# Patient Record
Sex: Male | Born: 1975 | Race: Black or African American | Hispanic: No | Marital: Single | Smoking: Current every day smoker
Health system: Southern US, Community
[De-identification: ages and names within clinical notes are randomized; demographics above are authoritative.]

## PROBLEM LIST (undated history)

## (undated) DIAGNOSIS — K56609 Unspecified intestinal obstruction, unspecified as to partial versus complete obstruction: Secondary | ICD-10-CM

## (undated) HISTORY — PX: ABDOMINAL SURGERY: SHX537

## (undated) HISTORY — DX: Unspecified intestinal obstruction, unspecified as to partial versus complete obstruction: K56.609

---

## 2004-09-05 ENCOUNTER — Emergency Department (HOSPITAL_COMMUNITY): Admission: EM | Admit: 2004-09-05 | Discharge: 2004-09-05 | Payer: Self-pay | Admitting: Emergency Medicine

## 2012-07-02 ENCOUNTER — Emergency Department (HOSPITAL_COMMUNITY)
Admission: EM | Admit: 2012-07-02 | Discharge: 2012-07-02 | Disposition: A | Discharge status: Home / Self Care | Payer: Self-pay | Attending: Emergency Medicine | Admitting: Emergency Medicine

## 2012-07-02 ENCOUNTER — Emergency Department (HOSPITAL_COMMUNITY): Payer: Self-pay

## 2012-07-02 ENCOUNTER — Encounter (HOSPITAL_COMMUNITY): Payer: Self-pay | Admitting: *Deleted

## 2012-07-02 DIAGNOSIS — W208XXA Other cause of strike by thrown, projected or falling object, initial encounter: Secondary | ICD-10-CM | POA: Insufficient documentation

## 2012-07-02 DIAGNOSIS — R079 Chest pain, unspecified: Secondary | ICD-10-CM | POA: Insufficient documentation

## 2012-07-02 DIAGNOSIS — S20229A Contusion of unspecified back wall of thorax, initial encounter: Secondary | ICD-10-CM | POA: Insufficient documentation

## 2012-07-02 DIAGNOSIS — W19XXXA Unspecified fall, initial encounter: Secondary | ICD-10-CM

## 2012-07-02 DIAGNOSIS — S20219A Contusion of unspecified front wall of thorax, initial encounter: Secondary | ICD-10-CM | POA: Insufficient documentation

## 2012-07-02 DIAGNOSIS — R109 Unspecified abdominal pain: Secondary | ICD-10-CM | POA: Insufficient documentation

## 2012-07-02 DIAGNOSIS — S301XXA Contusion of abdominal wall, initial encounter: Secondary | ICD-10-CM | POA: Insufficient documentation

## 2012-07-02 DIAGNOSIS — F17200 Nicotine dependence, unspecified, uncomplicated: Secondary | ICD-10-CM | POA: Insufficient documentation

## 2012-07-02 DIAGNOSIS — R296 Repeated falls: Secondary | ICD-10-CM | POA: Insufficient documentation

## 2012-07-02 DIAGNOSIS — M549 Dorsalgia, unspecified: Secondary | ICD-10-CM | POA: Insufficient documentation

## 2012-07-02 LAB — URINALYSIS, ROUTINE W REFLEX MICROSCOPIC
Bilirubin Urine: NEGATIVE
Glucose, UA: NEGATIVE mg/dL
Nitrite: NEGATIVE
Specific Gravity, Urine: 1.02 (ref 1.005–1.030)

## 2012-07-02 LAB — URINE MICROSCOPIC-ADD ON

## 2012-07-02 NOTE — ED Notes (Signed)
Patient transported to X-ray 

## 2012-07-02 NOTE — ED Provider Notes (Signed)
History     CSN: [id number]  Arrival date & time 07/02/12  1158   First MD Initiated Contact with Patient 07/02/12 1227      Chief Complaint  Patient presents with  . Back Pain  . Chest Pain  . Abdominal Pain    (Consider location/radiation/quality/duration/timing/severity/associated sxs/prior treatment) Patient is a 36 y.o. male presenting with back pain, chest pain, and abdominal pain. The history is provided by the patient.  Back Pain  Associated symptoms include chest pain and abdominal pain.  Chest Pain Primary symptoms include abdominal pain.    Abdominal Pain The primary symptoms of the illness include abdominal pain.  Additional symptoms associated with the illness include back pain.  He was carrying a 60 inch television with a friend when he fell with the TV landing on his chest and abdomen. Since then, he has been having pain in his chest, abdomen, and back. Denies head injury. Pain has been improving and is currently rated at last 10. However, today, he urinated and it looked like blood. He has a history of abdominal surgery for bowel obstruction on 2 occasions. He is otherwise in good health. He denies dizziness or weakness or nausea or vomiting.  History reviewed. No pertinent past medical history.  Past Surgical History  Procedure Date  . Abdominal surgery     bowel obstruction x 2    History reviewed. No pertinent family history.  History  Substance Use Topics  . Smoking status: Current Everyday Smoker  . Smokeless tobacco: Not on file  . Alcohol Use: Yes      Review of Systems  Cardiovascular: Positive for chest pain.  Gastrointestinal: Positive for abdominal pain.  Musculoskeletal: Positive for back pain.  All other systems reviewed and are negative.    Allergies  Review of patient's allergies indicates not on file.  Home Medications  No current outpatient prescriptions on file.  BP 135/86  Pulse 83  Temp 98.2 F (36.8 C) (Oral)  Resp  18  SpO2 99%  Physical Exam  Nursing note and vitals reviewed.  36 year old male who is resting comfortably and in no acute distress. Vital signs are normal. Oxygen saturation is 99% which is normal. Head is normocephalic and atraumatic. PERRLA, EOMI. Neck is nontender. Back is nontender lungs are clear without rales, wheezes, rhonchi. Heart has regular rate rhythm without murmur. There is no chest wall tenderness. Abdomen is soft with only mild tenderness across the upper abdomen. There is no rebound or guarding. Peristalsis is normal active. Extremities have full range of motion, no cyanosis or edema. Skin is warm and dry without rash. Neurologic: Mental status is normal, cranial nerves are intact, there are no motor or sensory deficits.  ED Course  Procedures (including critical care time)  Results for orders placed during the hospital encounter of 07/02/12  URINALYSIS, ROUTINE W REFLEX MICROSCOPIC      Component Value Range   Color, Urine YELLOW  YELLOW   APPearance CLEAR  CLEAR   Specific Gravity, Urine 1.020  1.005 - 1.030   pH 6.0  5.0 - 8.0   Glucose, UA NEGATIVE  NEGATIVE mg/dL   Hgb urine dipstick NEGATIVE  NEGATIVE   Bilirubin Urine NEGATIVE  NEGATIVE   Ketones, ur NEGATIVE  NEGATIVE mg/dL   Protein, ur NEGATIVE  NEGATIVE mg/dL   Urobilinogen, UA 0.2  0.0 - 1.0 mg/dL   Nitrite NEGATIVE  NEGATIVE   Leukocytes, UA TRACE (*) NEGATIVE  URINE MICROSCOPIC-ADD ON  Component Value Range   WBC, UA 0-2  <3 WBC/hpf   Dg Chest 2 View  07/02/2012  *RADIOLOGY REPORT*  Clinical Data: Back pain.  Chest pain.  Abdominal pain.  CHEST - 2 VIEW  Comparison: None.  Findings:  Cardiopericardial silhouette within normal limits. Mediastinal contours normal. Trachea midline.  No airspace disease or effusion.  No displaced sternal fracture is identified on the lateral view.  IMPRESSION: Negative two-view chest.  Original Report Authenticated By: Andreas Newport, M.D.   Dg Thoracic Spine  W/swimmers  07/02/2012  *RADIOLOGY REPORT*  Clinical Data: Fall.  Chest pain.  THORACIC SPINE - 2 VIEW + SWIMMERS  Comparison: None.  Findings: Anatomic alignment of the thoracic spine.  Vertebral body height and intervertebral disc spaces preserved.  Cervicothoracic junction normal on the swimmer's view.  Incidental note is made of an old right L1 transverse process fracture or small vestigial rib.  IMPRESSION: No acute abnormality.  Original Report Authenticated By: Andreas Newport, M.D.   Dg Lumbar Spine Complete  07/02/2012  *RADIOLOGY REPORT*  Clinical Data: Fall.  Back pain.  LUMBAR SPINE - COMPLETE 4+ VIEW  Comparison: None.  Findings: Mild levoconvex curvature may be positional or due to spasm.  Small ossicle is present adjacent to the right L1 transverse process.  This may represent vestigial rib or an old right L1 transverse process fracture.  This is well corticated. Vertebral body height is preserved.  There is no spondylolisthesis. Intervertebral disc spaces appear normal. There are no pars defects.  IMPRESSION: No acute abnormality.  Original Report Authenticated By: Andreas Newport, M.D.      1. Fall   2. Contusion of chest   3. Contusion, back   4. Contusion, abdominal wall       MDM  Contusions of the chest, abdomen, and back. Urinalysis will be obtained and if he actually is having hematuria, CT scan will be obtained to rule out internal injury. However, since he is hemodynamically stable several days after the fall and with no signs of serious injury on exam, I do not feel he needs any imaging of his urinalysis does not show blood.   Urinalysis has come back negative. Patient is informed of this. He states that he wants x-rays done of his back and chest and abdomen to be sure there is not any underlying injury. He was advised that there is no indication for CT scan and there is significant radiation exposure associated with CT scan. Also advised that findings on plain x-rays would  not change treatment but he still wants x-rays done and they were ordered.   Are negative for fracture. He is discharged with instructions to take over-the-counter analgesics as needed.  Dione Booze, MD 07/02/12 1438

## 2012-07-02 NOTE — ED Notes (Signed)
Pt back from X-ray.  

## 2012-07-02 NOTE — ED Notes (Signed)
Pt reports he was carrying a tv when he fell and now has pain in his abdomen chest and upper right side of back. Pt reports he had dark colored urine this morning and was concerned that he has blood in his urine.

## 2012-07-02 NOTE — ED Notes (Signed)
Pt states 3 days ago he was carrying a tv and he lost balance falling backwards and the tv landed ontop of him. Pt reports back, chest and abdominal discomfort. Reports dark urine this am. Ambulatory without difficulty. Reports pain increases with certain movements.

## 2012-10-22 ENCOUNTER — Emergency Department (HOSPITAL_COMMUNITY)
Admission: EM | Admit: 2012-10-22 | Discharge: 2012-10-22 | Disposition: A | Discharge status: Home / Self Care | Payer: No Typology Code available for payment source | Attending: Emergency Medicine | Admitting: Emergency Medicine

## 2012-10-22 ENCOUNTER — Emergency Department (HOSPITAL_COMMUNITY): Payer: No Typology Code available for payment source

## 2012-10-22 ENCOUNTER — Encounter (HOSPITAL_COMMUNITY): Payer: Self-pay | Admitting: Emergency Medicine

## 2012-10-22 DIAGNOSIS — S0993XA Unspecified injury of face, initial encounter: Secondary | ICD-10-CM | POA: Insufficient documentation

## 2012-10-22 DIAGNOSIS — M542 Cervicalgia: Secondary | ICD-10-CM

## 2012-10-22 DIAGNOSIS — S0990XA Unspecified injury of head, initial encounter: Secondary | ICD-10-CM | POA: Insufficient documentation

## 2012-10-22 DIAGNOSIS — Y939 Activity, unspecified: Secondary | ICD-10-CM | POA: Insufficient documentation

## 2012-10-22 DIAGNOSIS — Y929 Unspecified place or not applicable: Secondary | ICD-10-CM | POA: Insufficient documentation

## 2012-10-22 DIAGNOSIS — W208XXA Other cause of strike by thrown, projected or falling object, initial encounter: Secondary | ICD-10-CM | POA: Insufficient documentation

## 2012-10-22 DIAGNOSIS — F17200 Nicotine dependence, unspecified, uncomplicated: Secondary | ICD-10-CM | POA: Insufficient documentation

## 2012-10-22 MED ORDER — CYCLOBENZAPRINE HCL 10 MG PO TABS: 10.0000 mg | ORAL_TABLET | Freq: Three times a day (TID) | ORAL | Status: DC | PRN

## 2012-10-22 MED ORDER — IBUPROFEN 600 MG PO TABS: 600.0000 mg | ORAL_TABLET | Freq: Four times a day (QID) | ORAL | Status: DC | PRN

## 2012-10-22 NOTE — ED Notes (Signed)
Pt presents w/ after a mirror falling on the back of his head on Monday, states large, heavy mirror. Did not seek treatment at time of injury and was not knocked out. Is having headaches, neck and shoulder pain from the mirror throwing his head forward. Has not taken any meds for this. Denies blurred vision, or memory loss, a couple episodes of nausea w/o emesis.

## 2012-10-23 NOTE — ED Provider Notes (Signed)
Medical screening examination/treatment/procedure(s) were performed by non-physician practitioner and as supervising physician I was immediately available for consultation/collaboration.  Jones Skene, M.D.     Jones Skene, MD 10/23/12 [id number]

## 2012-11-04 NOTE — ED Provider Notes (Signed)
History     CSN: [id number]  Arrival date & time 10/22/12  1309   First MD Initiated Contact with Patient 10/22/12 1351      Chief Complaint  Patient presents with  . Head Injury    (Consider location/radiation/quality/duration/timing/severity/associated sxs/prior treatment) Patient is a 36 y.o. male presenting with head injury. The history is provided by the patient. No language interpreter was used.  Head Injury  The incident occurred more than 2 days ago. He came to the ER via walk-in. The injury mechanism was a direct blow. There was no loss of consciousness. There was no blood loss. The quality of the pain is described as throbbing. The pain is at a severity of 8/10. The pain is moderate. The pain has been constant since the injury. Pertinent negatives include no numbness, no blurred vision, no vomiting, no disorientation, no weakness and no memory loss. He has tried nothing for the symptoms.  36 yo male with c/o h/a with neck pain since a mirror fell on his head 3 days ago.  He has taken nothing for the pain.  No visible bruising or lumps.  Neuro in tact.  History reviewed. No pertinent past medical history.  Past Surgical History  Procedure Date  . Abdominal surgery     bowel obstruction x 2    No family history on file.  History  Substance Use Topics  . Smoking status: Current Every Day Smoker -- 1.0 packs/day    Types: Cigars  . Smokeless tobacco: Never Used  . Alcohol Use: 10.8 oz/week    7 Glasses of wine, 6 Shots of liquor, 6 Drinks containing 0.5 oz of alcohol per week      Review of Systems  Constitutional: Negative.  Negative for fever.  HENT: Positive for neck pain.   Eyes: Negative.  Negative for blurred vision.  Respiratory: Negative.   Cardiovascular: Negative.   Gastrointestinal: Negative.  Negative for nausea and vomiting.  Neurological: Positive for headaches. Negative for dizziness, syncope, facial asymmetry, speech difficulty, weakness,  light-headedness and numbness.  Psychiatric/Behavioral: Negative.  Negative for memory loss.  All other systems reviewed and are negative.    Allergies  Benadryl and Promethazine  Home Medications   Current Outpatient Rx  Name  Route  Sig  Dispense  Refill  . CYCLOBENZAPRINE HCL 10 MG PO TABS   Oral   Take 1 tablet (10 mg total) by mouth 3 (three) times daily as needed for muscle spasms.   30 tablet   0   . IBUPROFEN 600 MG PO TABS   Oral   Take 1 tablet (600 mg total) by mouth every 6 (six) hours as needed for pain.   30 tablet   0     BP 127/87  Pulse 80  Temp 98.6 F (37 C) (Oral)  SpO2 100%  Physical Exam  Nursing note and vitals reviewed. Constitutional: He is oriented to person, place, and time. He appears well-developed and well-nourished.  HENT:  Head: Normocephalic.  Eyes: Conjunctivae normal and EOM are normal. Pupils are equal, round, and reactive to light.  Neck: Trachea normal, normal range of motion and full passive range of motion without pain. Neck supple. Muscular tenderness present. No spinous process tenderness present. No rigidity. No edema, no erythema and normal range of motion present.       Nexus criteria met  Cardiovascular: Normal rate.   Pulmonary/Chest: Effort normal.  Abdominal: Soft.  Musculoskeletal: Normal range of motion.  Neurological: He is alert  and oriented to person, place, and time. He has normal reflexes. He displays normal reflexes. No cranial nerve deficit or sensory deficit. He exhibits normal muscle tone. Coordination and gait normal. GCS eye subscore is 4. GCS verbal subscore is 5. GCS motor subscore is 6.  Skin: Skin is warm and dry.  Psychiatric: He has a normal mood and affect.    ED Course  Procedures (including critical care time)  Labs Reviewed - No data to display No results found.   1. Head injury   2. Neck pain       MDM  Head injury 3 days ago with - CT of the head today.  Nexus criteria met.  No  need for cervical films.  Ibuprofen or tylenol for pain.  rx for flexeril.  Follow up with pcp of choice as needed.        Remi Haggard, NP 11/04/12 1103

## 2012-11-05 NOTE — ED Provider Notes (Signed)
Medical screening examination/treatment/procedure(s) were performed by non-physician practitioner and as supervising physician I was immediately available for consultation/collaboration.  John-Adam Quintana Canelo, M.D.     John-Adam Abie Killian, MD 11/05/12 0132 

## 2013-08-04 ENCOUNTER — Encounter (HOSPITAL_COMMUNITY): Payer: Self-pay | Admitting: Emergency Medicine

## 2013-08-04 ENCOUNTER — Emergency Department (INDEPENDENT_AMBULATORY_CARE_PROVIDER_SITE_OTHER)
Admission: EM | Admit: 2013-08-04 | Discharge: 2013-08-04 | Disposition: A | Discharge status: Home / Self Care | Payer: Self-pay | Source: Home / Self Care | Attending: Family Medicine | Admitting: Family Medicine

## 2013-08-04 DIAGNOSIS — L709 Acne, unspecified: Secondary | ICD-10-CM

## 2013-08-04 DIAGNOSIS — L708 Other acne: Secondary | ICD-10-CM

## 2013-08-04 MED ORDER — MINOCYCLINE HCL 50 MG PO CAPS: 100.0000 mg | ORAL_CAPSULE | Freq: Two times a day (BID) | ORAL | Status: DC

## 2013-08-04 NOTE — ED Notes (Signed)
Pt c/o small abscess on right side of right eye onset 2 weeks sxs include: pain, gradually getting worse Denies: fevers, drainage... Hx of abscess Alert w/no signs of acute distress.

## 2013-08-04 NOTE — ED Provider Notes (Signed)
  CSN: [id number]     Arrival date & time 08/04/13  1943 History     First MD Initiated Contact with Patient 08/04/13 2016     Chief Complaint  Patient presents with  . Abscess   (Consider location/radiation/quality/duration/timing/severity/associated sxs/prior Treatment) Patient is a 37 y.o. male presenting with abscess. The history is provided by the patient.  Abscess Location:  Face Facial abscess location:  Face Red streaking: no   Progression:  Worsening Chronicity:  New Relieved by:  Nothing Pt complains of large cyst that come up on his face.  Pt is out of minocycle and is requesting  History reviewed. No pertinent past medical history. Past Surgical History  Procedure Laterality Date  . Abdominal surgery      bowel obstruction x 2   No family history on file. History  Substance Use Topics  . Smoking status: Current Every Day Smoker -- 1.00 packs/day    Types: Cigars  . Smokeless tobacco: Never Used  . Alcohol Use: 10.8 oz/week    7 Glasses of wine, 6 Shots of liquor, 6 Drinks containing 0.5 oz of alcohol per week    Review of Systems  HENT: Positive for facial swelling.   All other systems reviewed and are negative.    Allergies  Benadryl and Promethazine  Home Medications   Current Outpatient Rx  Name  Route  Sig  Dispense  Refill  . cyclobenzaprine (FLEXERIL) 10 MG tablet   Oral   Take 1 tablet (10 mg total) by mouth 3 (three) times daily as needed for muscle spasms.   30 tablet   0   . ibuprofen (ADVIL,MOTRIN) 600 MG tablet   Oral   Take 1 tablet (600 mg total) by mouth every 6 (six) hours as needed for pain.   30 tablet   0   . minocycline (MINOCIN) 50 MG capsule   Oral   Take 2 capsules (100 mg total) by mouth 2 (two) times daily.   60 capsule   2    BP 125/93  Pulse 75  Temp(Src) 98 F (36.7 C) (Oral)  Resp 16  SpO2 100% Physical Exam  Nursing note and vitals reviewed. Constitutional: He appears well-developed and well-nourished.   HENT:  Head: Normocephalic and atraumatic.  Eyes: Conjunctivae are normal. Pupils are equal, round, and reactive to light.  Musculoskeletal: Normal range of motion.  Neurological: He is alert.  Skin: Skin is warm.  Large pimples/cyst both sides of face    ED Course   Procedures (including critical care time)  Labs Reviewed - No data to display No results found. 1. Acne     MDM  minocyline  Elson Areas, PA-C 08/04/13 2021  Lonia Skinner [place], [place] 08/04/13 2022

## 2013-08-07 NOTE — ED Provider Notes (Signed)
Medical screening examination/treatment/procedure(s) were performed by resident physician or non-physician practitioner and as supervising physician I was immediately available for consultation/collaboration.   Drue Camera DOUGLAS MD.   Kleber Crean D Helaina Stefano, MD 08/07/13 1512 

## 2014-02-10 ENCOUNTER — Encounter (HOSPITAL_COMMUNITY): Payer: Self-pay | Admitting: Emergency Medicine

## 2014-02-10 ENCOUNTER — Emergency Department (HOSPITAL_COMMUNITY)
Admission: EM | Admit: 2014-02-10 | Discharge: 2014-02-10 | Disposition: A | Discharge status: Home / Self Care | Payer: No Typology Code available for payment source | Attending: Emergency Medicine | Admitting: Emergency Medicine

## 2014-02-10 DIAGNOSIS — B349 Viral infection, unspecified: Secondary | ICD-10-CM

## 2014-02-10 DIAGNOSIS — B9789 Other viral agents as the cause of diseases classified elsewhere: Secondary | ICD-10-CM | POA: Insufficient documentation

## 2014-02-10 DIAGNOSIS — F17200 Nicotine dependence, unspecified, uncomplicated: Secondary | ICD-10-CM | POA: Insufficient documentation

## 2014-02-10 DIAGNOSIS — H109 Unspecified conjunctivitis: Secondary | ICD-10-CM | POA: Insufficient documentation

## 2014-02-10 DIAGNOSIS — R112 Nausea with vomiting, unspecified: Secondary | ICD-10-CM | POA: Insufficient documentation

## 2014-02-10 DIAGNOSIS — Z792 Long term (current) use of antibiotics: Secondary | ICD-10-CM | POA: Insufficient documentation

## 2014-02-10 MED ORDER — ONDANSETRON HCL 4 MG PO TABS: 4.0000 mg | ORAL_TABLET | Freq: Four times a day (QID) | ORAL | Status: DC

## 2014-02-10 MED ORDER — ONDANSETRON 4 MG PO TBDP
4.0000 mg | ORAL_TABLET | Freq: Once | ORAL | Status: AC
  Administered 2014-02-10: 4 mg via ORAL
  Filled 2014-02-10: qty 1

## 2014-02-10 MED ORDER — TETRACAINE HCL 0.5 % OP SOLN
2.0000 [drp] | Freq: Once | OPHTHALMIC | Status: AC
  Administered 2014-02-10: 2 [drp] via OPHTHALMIC
  Filled 2014-02-10: qty 2

## 2014-02-10 MED ORDER — ERYTHROMYCIN 5 MG/GM OP OINT: TOPICAL_OINTMENT | OPHTHALMIC | Status: DC

## 2014-02-10 MED ORDER — FLUORESCEIN SODIUM 1 MG OP STRP
1.0000 | ORAL_STRIP | Freq: Once | OPHTHALMIC | Status: AC
  Administered 2014-02-10: 1 via OPHTHALMIC
  Filled 2014-02-10: qty 1

## 2014-02-10 MED ORDER — ACETAMINOPHEN 325 MG PO TABS
650.0000 mg | ORAL_TABLET | Freq: Once | ORAL | Status: AC
  Administered 2014-02-10: 650 mg via ORAL
  Filled 2014-02-10: qty 2

## 2014-02-10 NOTE — ED Provider Notes (Signed)
CSN: [id number]     Arrival date & time 02/10/14  1209 History  This chart was scribed for non-physician practitioner, Raymon Mutton, PA-C working with Gavin Pound. Oletta Lamas, MD by Greggory Stallion, ED scribe. This patient was seen in room WTR7/WTR7 and the patient's care was started at 12:35 PM.   Chief Complaint  Patient presents with  . Eye Drainage  . Headache   The history is provided by the patient. No language interpreter was used.   HPI Comments: Jori Thrall is a 38 y.o. male who presents to the Emergency Department complaining of bilateral eye itching and yellow drainage that started 2 days ago. Symptoms started in the left eye and moved to the right. His eye is matted shut in the mornings. Pt states it doesn't feel like he has a foreign body in his eye. He states he also has a gradual onset, worsening, pressure headache that started last night. Pt has had nausea and one episode of emesis last night. Denies head injury. Denies fever, chills, nasal congestion, sore throat, difficulty swallowing, eye pain, blurred vision, sudden loss of vision, cough, SOB, difficulty breathing, chest pain, abdominal pain, constipation, diarrhea, neck pain, neck stiffness. Denies sick contacts.   History reviewed. No pertinent past medical history. Past Surgical History  Procedure Laterality Date  . Abdominal surgery      bowel obstruction x 2   No family history on file. History  Substance Use Topics  . Smoking status: Current Every Day Smoker -- 1.00 packs/day    Types: Cigars  . Smokeless tobacco: Never Used  . Alcohol Use: 10.8 oz/week    7 Glasses of wine, 6 Shots of liquor, 6 Drinks containing 0.5 oz of alcohol per week    Review of Systems  Constitutional: Negative for fever and chills.  HENT: Negative for congestion and trouble swallowing.   Eyes: Positive for discharge and itching. Negative for pain and visual disturbance.  Respiratory: Negative for cough and shortness of breath.    Cardiovascular: Negative for chest pain.  Gastrointestinal: Positive for nausea and vomiting. Negative for abdominal pain, diarrhea and constipation.  Musculoskeletal: Negative for neck pain and neck stiffness.  Neurological: Positive for headaches.  All other systems reviewed and are negative.   Allergies  Benadryl and Promethazine  Home Medications   Current Outpatient Rx  Name  Route  Sig  Dispense  Refill  . cyclobenzaprine (FLEXERIL) 10 MG tablet   Oral   Take 1 tablet (10 mg total) by mouth 3 (three) times daily as needed for muscle spasms.   30 tablet   0   . erythromycin ophthalmic ointment      Place a 1/2 inch ribbon of ointment into the lower eyelid of both eyes 4 times per day for 6 days.   1 g   0   . ibuprofen (ADVIL,MOTRIN) 600 MG tablet   Oral   Take 1 tablet (600 mg total) by mouth every 6 (six) hours as needed for pain.   30 tablet   0   . minocycline (MINOCIN) 50 MG capsule   Oral   Take 2 capsules (100 mg total) by mouth 2 (two) times daily.   60 capsule   2   . ondansetron (ZOFRAN) 4 MG tablet   Oral   Take 1 tablet (4 mg total) by mouth every 6 (six) hours.   12 tablet   0    BP 146/94  Pulse 85  Temp(Src) 98.5 F (36.9 C) (Oral)  Resp 18  SpO2 100%  Physical Exam  Nursing note and vitals reviewed. Constitutional: He is oriented to person, place, and time. He appears well-developed and well-nourished. No distress.  HENT:  Head: Normocephalic and atraumatic.  Mouth/Throat: Oropharynx is clear and moist. No oropharyngeal exudate.  Eyes: EOM are normal. Right eye exhibits discharge. Right eye exhibits no chemosis, no exudate and no hordeolum. No foreign body present in the right eye. Left eye exhibits discharge. Left eye exhibits no chemosis, no exudate and no hordeolum. No foreign body present in the left eye. Right conjunctiva is injected. Right conjunctiva has no hemorrhage. Left conjunctiva is injected. Left conjunctiva has no  hemorrhage. Right eye exhibits normal extraocular motion and no nystagmus. Left eye exhibits normal extraocular motion and no nystagmus. Right pupil is round and reactive. Left pupil is round and reactive.  Fundoscopic exam:      The right eye shows no AV nicking, no exudate, no hemorrhage and no papilledema.       The left eye shows no AV nicking, no exudate, no hemorrhage and no papilledema.  Slit lamp exam:      The right eye shows no corneal abrasion, no corneal flare, no corneal ulcer, no foreign body, no hyphema, no fluorescein uptake and no anterior chamber bulge.       The left eye shows no corneal abrasion, no corneal flare, no corneal ulcer, no foreign body, no hyphema, no fluorescein uptake and no anterior chamber bulge.  Neck: Normal range of motion. Neck supple. No tracheal deviation present.  Cardiovascular: Normal rate, regular rhythm and normal heart sounds.  Exam reveals no friction rub.   No murmur heard. Pulses:      Radial pulses are 2+ on the right side, and 2+ on the left side.  Pulmonary/Chest: Effort normal and breath sounds normal. No respiratory distress. He has no wheezes. He has no rales.  Musculoskeletal: Normal range of motion.  Full ROM to upper and lower extremities without difficulty noted, negative ataxia noted.  Lymphadenopathy:    He has no cervical adenopathy.  Neurological: He is alert and oriented to person, place, and time. No cranial nerve deficit. He exhibits normal muscle tone. Coordination normal.  Cranial nerves III-XII grossly intact  Skin: Skin is warm and dry.  Psychiatric: He has a normal mood and affect. His behavior is normal.    ED Course  Procedures (including critical care time)  DIAGNOSTIC STUDIES: Oxygen Saturation is 100% on RA, normal by my interpretation.    COORDINATION OF CARE: 12:43 PM-Discussed treatment plan which includes visual acuity and checking for corneal abrasion or ulcer with pt at bedside and pt agreed to plan.    Labs Review Labs Reviewed - No data to display Imaging Review No results found.  EKG Interpretation   None       MDM   Final diagnoses:  Conjunctivitis  Viral syndrome   Filed Vitals:   02/10/14 1212  BP: 146/94  Pulse: 85  Temp: 98.5 F (36.9 C)  TempSrc: Oral  Resp: 18  SpO2: 100%   I personally performed the services described in this documentation, which was scribed in my presence. The recorded information has been reviewed and is accurate.  Patient presenting to the ED with bilateral eye infection that has been ongoing for the past 2 days. Patient reported that the eye discomfort started in his left eye - reported that the left eye was tearing, crusts on eyelashes and difficulty opening the lids up  in the morning. Stated that he was using the same wash rag and itching the eyes. Reported that the right eye started the other day with the same symptoms. Reported that he has been having a headache and reported feeling nauseous - reported that he had one episode of emesis while coming in to the ED. Reported that the headache was a gradual onset - denied thunderclap and worst headache of life - described as a pressure sensation to the nasal bridge. Denied abdominal pain, BM changes, chest pain, shortness of breath, difficulty breathing, urinary issues, neck pain, neck stiffness, changes to appetite.  Alert and oriented. GCS 15. Heart rate and rhythm normal. Lungs clear to auscultation. Radial pulses 2+ bilaterally. Cap refill less than 3 seconds. Negative neck stiffness, negative rigidity-cervical lymphadenopathy. Negative trismus. Unremarkable oral exam. Bilateral eyes with positive injection to the sclerae with positive active tearing noted-clear tears. Mild discharge noted to the medial canthus of bilateral eyes. Injected palpebral conjunctiva bilaterally. Negative swelling or erythema surrounding the orbits. EOMs intact. PERRLA. Negative pain upon palpation to the frontal  maxillary sinuses. Discomfort upon palpation to the nasal bone-nasal bridge. Fundoscopic exam negative findings. Slit-lamp negative for corneal abrasion, negative uptake. Negative Seidel sign. Negative dendritic lesion. Negative findings on visual acuity. Doubt herpetic keratitis. Doubt irisitis. Doubt preseptal and post-septal cellulitis. Doubt corneal abrasion. Doubt SAH. Doubt ICH. Doubt SBO. Suspicion to be bilateral conjunctivitis with viral syndrome. Patient stable, afebrile. Nausea controlled in ED setting. Headache controlled in ED setting. Patient able to tolerate fluids and food - negative episodes of emesis in ED setting. Discharged patient with antibiotics and zofran. Referred patient to urgent care Center and ophthalmologist. Discussed with patient to rest and stay hydrated. Discussed with patient to closely monitor symptoms and if symptoms are to worsen or change to report back to the ED - strict return instructions given.  Patient agreed to plan of care, understood, all questions answered.   Raymon Mutton, PA-C 02/11/14 [id number]

## 2014-02-10 NOTE — Discharge Instructions (Signed)
Please call and set-up an appointment with Dr. Karleen HampshireSpencer to be re-assessed - eye doctor Please rest and stay hydrated Please take antibiotics as prescribed Please wash each shirts, pillow case, read your face touches to prevent further spreading Please avoid any fat or greasy foods.  Please continue monitor symptoms closely and if symptoms are to worsen or change (fever greater than 101, chills, neck pain, neck stiffness, worsening eye pain, swelling to the eyes, worsening drainage, alert vision, visual changes, numbness, tingling) please report back to emergency department immediately   Bacterial Conjunctivitis Bacterial conjunctivitis (commonly called pink eye) is redness, soreness, or puffiness (inflammation) of the white part of your eye. It is caused by a germ called bacteria. These germs can easily spread from person to person (contagious). Your eye often will become red or pink. Your eye may also become irritated, watery, or have a thick discharge.  HOME CARE   Apply a cool, clean washcloth over closed eyelids. Do this for 10 20 minutes, 3 4 times a day while you have pain.  Gently wipe away any fluid coming from the eye with a warm, wet washcloth or cotton ball.  Wash your hands often with soap and water. Use paper towels to dry your hands.  Do not share towels or washcloths.  Change or wash your pillowcase every day.  Do not use eye makeup until the infection is gone.  Do not use machines or drive if your vision is blurry.  Stop using contact lenses. Do not use them again until your doctor says it is okay.  Do not touch the tip of the eye drop bottle or medicine tube with your fingers when you put medicine on the eye. GET HELP RIGHT AWAY IF:   Your eye is not better after 3 days of starting your medicine.  You have a yellowish fluid coming out of the eye.  You have more pain in the eye.  Your eye redness is spreading.  Your vision becomes blurry.  You have a fever or  lasting symptoms for more than 2-3 days.  You have a fever and your symptoms suddenly get worse.  You have pain in the face.  Your face gets red or puffy (swollen). MAKE SURE YOU:   Understand these instructions.  Will watch this condition.  Will get help right away if you are not doing well or get worse. Document Released: 09/22/2008 Document Revised: 11/30/2012 Document Reviewed: 09/22/2008 [facility] Patient Information 2014 [place], [place].   Emergency Department Resource Guide 1) Find a Doctor and Pay Out of Pocket Although you won't have to find out who is covered by your insurance plan, it is a good idea to ask around and get recommendations. You will then need to call the office and see if the doctor you have chosen will accept you as a new patient and what types of options they offer for patients who are self-pay. Some doctors offer discounts or will set up payment plans for their patients who do not have insurance, but you will need to ask so you aren't surprised when you get to your appointment.  2) Contact Your Local Health Department Not all health departments have doctors that can see patients for sick visits, but many do, so it is worth a call to see if yours does. If you don't know where your local health department is, you can check in your phone book. The CDC also has a tool to help you locate your state's health department, and  many state websites also have listings of all of their local health departments.  3) Find a Walk-in Clinic If your illness is not likely to be very severe or complicated, you may want to try a walk in clinic. These are popping up all over the country in pharmacies, drugstores, and shopping centers. They're usually staffed by nurse practitioners or physician assistants that have been trained to treat common illnesses and complaints. They're usually fairly quick and inexpensive. However, if you have serious medical issues or chronic medical problems,  these are probably not your best option.  No Primary Care Doctor: - Call Health Connect at  [phone] - they can help you locate a primary care doctor that  accepts your insurance, provides certain services, etc. - Physician Referral Service- [phone]  Chronic Pain Problems: Organization         Address  Phone   Notes  [facility]  [phone] Patients need to be referred by their primary care doctor.   Medication Assistance: Organization         Address  Phone   Notes  [facility] Medication [facility] [address] [place], [place] [address] [phone] --Must be a resident of [place] -- Must have NO insurance coverage whatsoever (no Medicaid/ Medicare, etc.) -- The pt. MUST have a primary care doctor that directs their care regularly and follows them in the community   MedAssist  [phone]   [employer]  [phone]    Agencies that provide inexpensive medical care: Organization         Address  Phone   Notes  [facility]  [phone]   [facility]    [phone]) [phone]   [facility] [address] [place], [place] [phone]   [facility] [address], [place] [phone]   Planned Parenthood    [phone](336) [phone]   [facility]    [phone]   [facility]  [address], [place] Phone:  [phone]) [phone], Fax:  [phone] Hours of Operation:  9 am - 6 pm, M-F.  Also accepts Medicaid/Medicare and self-pay.  [facility]  [address], [place] Phone: [phone]) [phone], Fax: [phone]. Hours of Operation:  8:30 am - 5:30 pm, M-F.  Also accepts Medicaid and self-pay.  [facility] [place] [address], [place] Phone: [phone]) [phone]   [facility] [address], [place], [place] [phone], Ext. [phone] Mondays &  Thursdays: 7-9 AM.  First 15 patients are seen on a first come, first serve basis.    Medicaid-accepting [facility] Providers:  Organization         Address  Phone   Notes  [facility] [address], [place] [phone] Also accepts self-pay patients.  [facility] [address], [place]  [phone]   [facility] [address], [place] [phone]) [phone]   [facility] [address] [place] [address], [place] [phone]) [phone]   Renaye RakersVeita Bland [address], [place]   [phone] Only accepts [place] Access [place] patients after they have their name applied to their card.   Self-Pay (no insurance) in [facility] [place]:  Organization  Address  Phone   Notes  Sickle Cell Patients, [facility] [address] [place], [place] [phone]   [facility] [address] [place], [place] [phone]   [facility] [place]  [address], [place] [phone]   [facility]/Dr. Osei-Bonsu  [address], [place] or [address], [place] [phone] Phone number for both [place] and [place] locations is the same.  [facility] [address] [place] [phone]   [facility] [address], [place] or [address] [phone]   [facility] [address], [place] [phone], phone; [phone], fax Sees patients 1st and 3rd Saturday of every month.  Must not qualify for public or private insurance (i.e. Medicaid, Medicare, [place] Health Choice, Veterans' Benefits)  Household income should be no more than 200% of the poverty level The clinic cannot treat you if you are pregnant or think you are pregnant  Sexually transmitted diseases are not treated at the  clinic.    Dental Care: Organization         Address  Phone  Notes   [facility] [address] [place], [place] [phone] Accepts children up to age 59 who are enrolled in [place] or [place] Health Choice; pregnant women with a Medicaid card; and children who have applied for Medicaid or [place] Health Choice, but were declined, whose parents can pay a reduced fee at time of service.  [facility] Department of [facility]  [address], [place] [phone] Accepts children up to age 37 who are enrolled in [place] or [place] Health Choice; pregnant women with a Medicaid card; and children who have applied for Medicaid or [place] Health Choice, but were declined, whose parents can pay a reduced fee at time of service.  [facility]  [address] [place], [place] [phone] Patients are seen by appointment only. Walk-ins are not accepted. [facility] will see patients 45 years of age and older. Monday - Tuesday (8am-5pm) Most Wednesdays (8:30-5pm) $30 per visit, cash only  [facility] Adult Dental Access PROGRAM  [address], [facility] [phone] Patients are seen by appointment only. Walk-ins are not accepted. [facility] will see patients 54 years of age and older. One Wednesday Evening (Monthly: Volunteer Based).  $30 per visit, cash only  [employer]  [phone] for adults; Children under age 77, call Graduate Pediatric Dentistry at [phone]. Children aged 86-14, please call [phone] to request a pediatric application.  Dental services are provided in all areas of dental care including fillings, crowns and bridges, complete and partial dentures, implants, gum treatment, root canals, and extractions. Preventive care is also provided. Treatment is provided to both adults and children. Patients are selected via a lottery and there is often a  waiting list.   [facility] [address], [place]  [phone] [email or web address]   [facility] [address] [place], [place] [phone], Ext. [phone] Second and Fourth Thursday of each month, opens at 6:30 AM; Clinic ends at 9 AM.  Patients are seen on a first-come first-served basis, and a limited number are seen during each clinic.   [facility]  [address] [place], [place]  [place], [place] [phone]   Eligibility Requirements You must have lived in [place], [place], or [place] counties for at least the last three months.   You cannot be eligible for state or federal sponsored [employer], including [employer], [place], or [employer].   You generally cannot be eligible for healthcare insurance through your employer.    How to apply: Eligibility screenings are held every Tuesday and Wednesday afternoon from 1:00 pm until 4:00 pm. You do not need an appointment for the interview!  [facility] [address], [place], [place]   [place]  [place] Department  [place]  [phone]    Behavioral Health Resources in the Community: Intensive Outpatient Programs Organization         Address  Phone  Notes  [facility] [place]. [address] [place], [place] [phone]   [facility] Outpatient [address] [place], [place]   ADS: [facility] [address] [place] [address] [place], [place]   [place] [address],  [place], [place] or [phone]   Substance Abuse Resources Organization         Address  Phone  Notes  [facility] Drug Services  [phone]   [place]  [phone]   The [place]   Chinita Pester  [phone]   Residential & Outpatient Substance Abuse  Program  [phone]   Psychological Services Organization         Address  Phone  Notes  [facility] [place]  [place]  [phone]   [place] [address], [place] or [phone]    Mobile Crisis Teams Organization         Address  Phone  Notes  [facility], Mobile Crisis Care Unit  [phone]   [facility]  [address] [place], [place]   [place] [address], [place] [phone]    Self-Help/Support Groups Organization         Address  Phone             Notes  [place] of [place] - variety of support groups  [place] Call for more information  Narcotics Anonymous (NA), [facility] [address], [employer] [place]  2 meetings at this location   Special educational needs teacher         Address  Phone  Notes  [facility] [place],    [place]  [phone]   [facility]  [address] [place] [phone], [place], [place]   Alexander Hepler, [place] [phone] Admissions: 8am-3pm M-F  Incentives Substance [facility] [address],    [place], [place] [phone]   [facility] [address] Jadene Pierini [place], [place]   [facility] [address],  [place], [place]   [facility]ight Programs - Intensive Outpatient [address] Kristeen [address], [place], [place]   [facility] ([place].,  [place], [place] or [phone]   [facility] (RTS) [address] [place], [place] Accepts Medicaid  [facility] [place] [address],  [place] [phone] Substance Abuse/Addiction Treatment   [facility] Resources Organization  Address  Phone  Notes  °[facility]  [phone]   °Julie Brannon, PhD [address] [place], [place]   [phone] or [phone]   °[facility]   [address] °[place], [place] [phone]   °[facility] [address], [place], [place] [phone] Insurance/Medicaid/sponsorship through [facility]  °[facility] [address]                                    [place], [place] [phone] Therapy/tele-psych/case  °[facility] [address]  ° [place], [place] [phone]    °Dr. Arfeen  [phone]   °[facility] [place]  [employer] [place] [facility] 1) [address], [place] °2) [address], [place] °3)  [address], [place] [phone]) [phone] °([phone] ° °([phone]) [phone]   °[place] Child Abuse Hotline (336) [phone] or (336) [phone] (After Hours)    ° ° ° °

## 2014-02-10 NOTE — ED Notes (Signed)
Pt given water for fluid challenge 

## 2014-02-10 NOTE — ED Notes (Addendum)
Pt reports bilateral eye drainage and pain for the past two days. Pt states he is concerned about pink eye, however denies contact with known infected persons. Pt also reports 8/10 anterior headache that started last night. Pt is A/O x4, vitals are WDL, and pt is in NAD.

## 2014-02-12 NOTE — ED Provider Notes (Signed)
Medical screening examination/treatment/procedure(s) were performed by non-physician practitioner and as supervising physician I was immediately available for consultation/collaboration.      Kristena Wilhelmi Y. Erica Osuna, MD 02/12/14 0852 

## 2014-07-13 ENCOUNTER — Encounter (HOSPITAL_COMMUNITY): Payer: Self-pay | Admitting: Emergency Medicine

## 2014-07-13 ENCOUNTER — Emergency Department (HOSPITAL_COMMUNITY)
Admission: EM | Admit: 2014-07-13 | Discharge: 2014-07-13 | Disposition: A | Discharge status: Home / Self Care | Payer: No Typology Code available for payment source | Source: Home / Self Care | Attending: Family Medicine | Admitting: Family Medicine

## 2014-07-13 DIAGNOSIS — G589 Mononeuropathy, unspecified: Secondary | ICD-10-CM

## 2014-07-13 DIAGNOSIS — G629 Polyneuropathy, unspecified: Secondary | ICD-10-CM

## 2014-07-13 DIAGNOSIS — M25539 Pain in unspecified wrist: Secondary | ICD-10-CM

## 2014-07-13 DIAGNOSIS — G5600 Carpal tunnel syndrome, unspecified upper limb: Secondary | ICD-10-CM

## 2014-07-13 MED ORDER — MELOXICAM 15 MG PO TABS: 15.0000 mg | ORAL_TABLET | Freq: Every day | ORAL | Status: DC

## 2014-07-13 NOTE — ED Notes (Signed)
Pt  Reports      Numbness  r  Hand   With  Pain in  Both   Hands     He  Reports  He  Uses  Hands  A  Lot  At  Work         -  He  denys  Any  specefic  Injury         He  Reports  The  Symptoms  Have  Been getting  Gradually  Worse  For  4- 6 weeks

## 2014-07-13 NOTE — Discharge Instructions (Signed)
Carpal Tunnel Syndrome The carpal tunnel is a narrow area located on the palm side of your wrist. The tunnel is formed by the wrist bones and ligaments. Nerves, blood vessels, and tendons pass through the carpal tunnel. Repeated wrist motion or certain diseases may cause swelling within the tunnel. This swelling pinches the main nerve in the wrist (median nerve) and causes the painful hand and arm condition called carpal tunnel syndrome. CAUSES   Repeated wrist motions.  Wrist injuries.  Certain diseases like arthritis, diabetes, alcoholism, hyperthyroidism, and kidney failure.  Obesity.  Pregnancy. SYMPTOMS   A "pins and needles" feeling in your fingers or hand.  Tingling or numbness in your fingers or hand.  An aching feeling in your entire arm.  Wrist pain that goes up your arm to your shoulder.  Pain that goes down into your palm or fingers.  A weak feeling in your hands. DIAGNOSIS  Your caregiver will take your history and perform a physical exam. An electromyography test may be needed. This test measures electrical signals sent out by the muscles. The electrical signals are usually slowed by carpal tunnel syndrome. You may also need X-rays. TREATMENT  Carpal tunnel syndrome may clear up by itself. Your caregiver may recommend a wrist splint or medicine such as a nonsteroidal anti-inflammatory medicine. Cortisone injections may help. Sometimes, surgery may be needed to free the pinched nerve.  HOME CARE INSTRUCTIONS   Take all medicine as directed by your caregiver. Only take over-the-counter or prescription medicines for pain, discomfort, or fever as directed by your caregiver.  If you were given a splint to keep your wrist from bending, wear it as directed. It is important to wear the splint at night. Wear the splint for as long as you have pain or numbness in your hand, arm, or wrist. This may take 1 to 2 months.  Rest your wrist from any activity that may be causing your  pain. If your symptoms are work-related, you may need to talk to your employer about changing to a job that does not require using your wrist.  Put ice on your wrist after long periods of wrist activity.  Put ice in a plastic bag.  Place a towel between your skin and the bag.  Leave the ice on for 15-20 minutes, 03-04 times a day.  Keep all follow-up visits as directed by your caregiver. This includes any orthopedic referrals, physical therapy, and rehabilitation. Any delay in getting necessary care could result in a delay or failure of your condition to heal. SEEK IMMEDIATE MEDICAL CARE IF:   You have new, unexplained symptoms.  Your symptoms get worse and are not helped or controlled with medicines. MAKE SURE YOU:   Understand these instructions.  Will watch your condition.  Will get help right away if you are not doing well or get worse. Document Released: 12/11/2000 Document Revised: 03/07/2012 Document Reviewed: 10/30/2011 [facility] Patient Information 2015 [place], [place]. This information is not intended to replace advice given to you by your health care provider. Make sure you discuss any questions you have with your health care provider.  Neuropathic Pain We often think that pain has a physical cause. If we get rid of the cause, the pain should go away. Nerves themselves can also cause pain. It is called neuropathic pain, which means nerve abnormality. It may be difficult for the patients who have it and for the treating caregivers. Pain is usually described as acute (short-lived) or chronic (long-lasting). Acute pain is  related to the physical sensations caused by an injury. It can last from a few seconds to many weeks, but it usually goes away when normal healing occurs. Chronic pain lasts beyond the typical healing time. With neuropathic pain, the nerve fibers themselves may be damaged or injured. They then send incorrect signals to other pain centers. The pain you feel is real,  but the cause is not easy to find.  CAUSES  Chronic pain can result from diseases, such as diabetes and shingles (an infection related to chickenpox), or from trauma, surgery, or amputation. It can also happen without any known injury or disease. The nerves are sending pain messages, even though there is no identifiable cause for such messages.   Other common causes of neuropathy include diabetes, phantom limb pain, or Regional Pain Syndrome (RPS).  As with all forms of chronic back pain, if neuropathy is not correctly treated, there can be a number of associated problems that lead to a downward cycle for the patient. These include depression, sleeplessness, feelings of fear and anxiety, limited social interaction and inability to do normal daily activities or work.  The most dramatic and mysterious example of neuropathic pain is called "phantom limb syndrome." This occurs when an arm or a leg has been removed because of illness or injury. The brain still gets pain messages from the nerves that originally carried impulses from the missing limb. These nerves now seem to misfire and cause troubling pain.  Neuropathic pain often seems to have no cause. It responds poorly to standard pain treatment. Neuropathic pain can occur after:  Shingles (herpes zoster virus infection).  A lasting burning sensation of the skin, caused usually by injury to a peripheral nerve.  Peripheral neuropathy which is widespread nerve damage, often caused by diabetes or alcoholism.  Phantom limb pain following an amputation.  Facial nerve problems (trigeminal neuralgia).  Multiple sclerosis.  Reflex sympathetic dystrophy.  Pain which comes with cancer and cancer chemotherapy.  Entrapment neuropathy such as when pressure is put on a nerve such as in carpal tunnel syndrome.  Back, leg, and hip problems (sciatica).  Spine or back surgery.  HIV Infection or AIDS where nerves are infected by viruses. Your  caregiver can explain items in the above list which may apply to you. SYMPTOMS  Characteristics of neuropathic pain are:  Severe, sharp, electric shock-like, shooting, lightening-like, knife-like.  Pins and needles sensation.  Deep burning, deep cold, or deep ache.  Persistent numbness, tingling, or weakness.  Pain resulting from light touch or other stimulus that would not usually cause pain.  Increased sensitivity to something that would normally cause pain, such as a pinprick. Pain may persist for months or years following the healing of damaged tissues. When this happens, pain signals no longer sound an alarm about current injuries or injuries about to happen. Instead, the alarm system itself is not working correctly.  Neuropathic pain may get worse instead of better over time. For some people, it can lead to serious disability. It is important to be aware that severe injury in a limb can occur without a proper, protective pain response.Burns, cuts, and other injuries may go unnoticed. Without proper treatment, these injuries can become infected or lead to further disability. Take any injury seriously, and consult your caregiver for treatment. DIAGNOSIS  When you have a pain with no known cause, your caregiver will probably ask some specific questions:   Do you have any other conditions, such as diabetes, shingles, multiple sclerosis, or  HIV infection?  How would you describe your pain? (Neuropathic pain is often described as shooting, stabbing, burning, or searing.)  Is your pain worse at any time of the day? (Neuropathic pain is usually worse at night.)  Does the pain seem to follow a certain physical pathway?  Does the pain come from an area that has missing or injured nerves? (An example would be phantom limb pain.)  Is the pain triggered by minor things such as rubbing against the sheets at night? These questions often help define the type of pain involved. Once your  caregiver knows what is happening, treatment can begin. Anticonvulsant, antidepressant drugs, and various pain relievers seem to work in some cases. If another condition, such as diabetes is involved, better management of that disorder may relieve the neuropathic pain.  TREATMENT  Neuropathic pain is frequently long-lasting and tends not to respond to treatment with narcotic type pain medication. It may respond well to other drugs such as antiseizure and antidepressant medications. Usually, neuropathic problems do not completely go away, but partial improvement is often possible with proper treatment. Your caregivers have large numbers of medications available to treat you. Do not be discouraged if you do not get immediate relief. Sometimes different medications or a combination of medications will be tried before you receive the results you are hoping for. See your caregiver if you have pain that seems to be coming from nowhere and does not go away. Help is available.  SEEK IMMEDIATE MEDICAL CARE IF:   There is a sudden change in the quality of your pain, especially if the change is on only one side of the body.  You notice changes of the skin, such as redness, black or purple discoloration, swelling, or an ulcer.  You cannot move the affected limbs. Document Released: 09/10/2004 Document Revised: 03/07/2012 Document Reviewed: 09/10/2004 [facility] Patient Information 2015 [place], [place]. This information is not intended to replace advice given to you by your health care provider. Make sure you discuss any questions you have with your health care provider.  Wrist Pain Wrist injuries are frequent in adults and children. A sprain is an injury to the ligaments that hold your bones together. A strain is an injury to muscle or muscle cord-like structures (tendons) from stretching or pulling. Generally, when wrists are moderately tender to touch following a fall or injury, a break in the bone (fracture) may  be present. Most wrist sprains or strains are better in 3 to 5 days, but complete healing may take several weeks. HOME CARE INSTRUCTIONS   Put ice on the injured area.  Put ice in a plastic bag.  Place a towel between your skin and the bag.  Leave the ice on for 15-20 minutes, 3-4 times a day, for the first 2 days, or as directed by your health care provider.  Keep your arm raised above the level of your heart whenever possible to reduce swelling and pain.  Rest the injured area for at least 48 hours or as directed by your health care provider.  If a splint or elastic bandage has been applied, use it for as long as directed by your health care provider or until seen by a health care provider for a follow-up exam.  Only take over-the-counter or prescription medicines for pain, discomfort, or fever as directed by your health care provider.  Keep all follow-up appointments. You may need to follow up with a specialist or have follow-up X-rays. Improvement in pain level is not  a guarantee that you did not fracture a bone in your wrist. The only way to determine whether or not you have a broken bone is by X-ray. SEEK IMMEDIATE MEDICAL CARE IF:   Your fingers are swollen, very red, white, or cold and blue.  Your fingers are numb or tingling.  You have increasing pain.  You have difficulty moving your fingers. MAKE SURE YOU:   Understand these instructions.  Will watch your condition.  Will get help right away if you are not doing well or get worse. Document Released: 09/23/2005 Document Revised: 12/19/2013 Document Reviewed: 02/04/2011 [facility] Patient Information 2015 [place], [place]. This information is not intended to replace advice given to you by your health care provider. Make sure you discuss any questions you have with your health care provider.

## 2014-07-13 NOTE — ED Provider Notes (Signed)
CSN: [id number]     Arrival date & time 07/13/14  1455 History   First MD Initiated Contact with Patient 07/13/14 1515     Chief Complaint  Patient presents with  . Hand Problem   (Consider location/radiation/quality/duration/timing/severity/associated sxs/prior Treatment) HPI Comments: 38 year old male presents complaining of left wrist pain, left hand pain, right hand numbness in his second third and fourth digits. This started about 3 weeks ago and has gotten progressively worse. He just started a job at [employer] 6 weeks ago, he chops food all day, he thinks this may be to blame. He has no other significant past medical history, no other injuries. He has never experienced this in the past previously. Ibuprofen does not help.   No past medical history on file. Past Surgical History  Procedure Laterality Date  . Abdominal surgery      bowel obstruction x 2   History reviewed. No pertinent family history. History  Substance Use Topics  . Smoking status: Current Every Day Smoker -- 1.00 packs/day    Types: Cigars  . Smokeless tobacco: Never Used  . Alcohol Use: 10.8 oz/week    7 Glasses of wine, 6 Shots of liquor, 6 Drinks containing 0.5 oz of alcohol per week    Review of Systems  Musculoskeletal: Positive for arthralgias and myalgias.       See history of present illness  Neurological: Positive for numbness.  All other systems reviewed and are negative.   Allergies  Benadryl and Promethazine  Home Medications   Prior to Admission medications   Medication Sig Start Date End Date Taking? Authorizing Provider  cyclobenzaprine (FLEXERIL) 10 MG tablet Take 1 tablet (10 mg total) by mouth 3 (three) times daily as needed for muscle spasms. 10/22/12   Remi HaggardAnne Crawford, NP  erythromycin ophthalmic ointment Place a 1/2 inch ribbon of ointment into the lower eyelid of both eyes 4 times per day for 6 days. 02/10/14   Marissa Sciacca, PA-C  ibuprofen (ADVIL,MOTRIN) 600 MG tablet Take 1  tablet (600 mg total) by mouth every 6 (six) hours as needed for pain. 10/22/12   Remi HaggardAnne Crawford, NP  meloxicam (MOBIC) 15 MG tablet Take 1 tablet (15 mg total) by mouth daily. 07/13/14   Adrian BlackwaterZachary H Lew Prout, PA-C  minocycline (MINOCIN) 50 MG capsule Take 2 capsules (100 mg total) by mouth 2 (two) times daily. 08/04/13   Elson AreasLeslie K Sofia, PA-C  ondansetron (ZOFRAN) 4 MG tablet Take 1 tablet (4 mg total) by mouth every 6 (six) hours. 02/10/14   Marissa Sciacca, PA-C   BP 141/74  Pulse 87  Temp(Src) 98.7 F (37.1 C) (Oral)  Resp 16  SpO2 100% Physical Exam  Nursing note and vitals reviewed. Constitutional: He is oriented to person, place, and time. He appears well-developed and well-nourished. No distress.  HENT:  Head: Normocephalic.  Cardiovascular:  Pulses:      Radial pulses are 2+ on the right side, and 2+ on the left side.  Pulmonary/Chest: Effort normal. No respiratory distress.  Musculoskeletal:       Right wrist: Normal.       Left wrist: Normal.       Right hand: Normal.       Left hand: Normal.  Neurological: He is alert and oriented to person, place, and time. He has normal strength and normal reflexes. A sensory deficit is present. No cranial nerve deficit. He exhibits normal muscle tone. Coordination and gait normal.  Altered sensation in the third, fourth, and fifth  digits of the right hand, but not numbness.  Skin: Skin is warm and dry. No rash noted. He is not diaphoretic.  Psychiatric: He has a normal mood and affect. Judgment normal.    ED Course  Procedures (including critical care time) Labs Review Labs Reviewed - No data to display  Imaging Review No results found.   MDM   1. Neuropathy   2. Wrist pain, acute, unspecified laterality   3. Carpal tunnel syndrome, unspecified laterality    Most consistent with carpal tunnel syndrome. We'll treat with wrist splints, daily NSAIDs, ice, and avoid repetitive motion activities for 2 weeks to allow this to heal. Referral  to neurology if no improvement.  Meds ordered this encounter  Medications  . meloxicam (MOBIC) 15 MG tablet    Sig: Take 1 tablet (15 mg total) by mouth daily.    Dispense:  30 tablet    Refill:  5    Order Specific Question:  Supervising Provider    Answer:  Clementeen Graham, Kathie Rhodes [[id number]]       Graylon Good, PA-C 07/13/14 1904

## 2014-07-15 NOTE — ED Provider Notes (Signed)
Medical screening examination/treatment/procedure(s) were performed by a resident physician or non-physician practitioner and as the supervising physician I was immediately available for consultation/collaboration.  Joann Jorge, MD    Pieper Kasik S Chrishawn Kring, MD 07/15/14 0852 

## 2015-03-20 ENCOUNTER — Ambulatory Visit (INDEPENDENT_AMBULATORY_CARE_PROVIDER_SITE_OTHER): Payer: Self-pay | Admitting: Emergency Medicine

## 2015-03-20 VITALS — BP 122/78 | HR 86 | Temp 98.1°F | Resp 18 | Ht 75.5 in | Wt 176.4 lb

## 2015-03-20 DIAGNOSIS — H02826 Cysts of left eye, unspecified eyelid: Secondary | ICD-10-CM

## 2015-03-20 DIAGNOSIS — L709 Acne, unspecified: Secondary | ICD-10-CM | POA: Insufficient documentation

## 2015-03-20 MED ORDER — DOXYCYCLINE HYCLATE 100 MG PO TABS: 100.0000 mg | ORAL_TABLET | Freq: Two times a day (BID) | ORAL | Status: DC

## 2015-03-20 NOTE — Patient Instructions (Signed)
Acne  Acne is a skin problem that causes pimples. Acne occurs when the pores in your skin get blocked. Your pores may become red, sore, and swollen (inflamed), or infected with a common skin bacterium (Propionibacterium acnes). Acne is a common skin problem. Up to 80% of people get acne at some time. Acne is especially common from the ages of 12 to 24. Acne usually goes away over time with proper treatment.  CAUSES   Your pores each contain an oil gland. The oil glands make an oily substance called sebum. Acne happens when these glands get plugged with sebum, dead skin cells, and dirt. The P. acnes bacteria that are normally found in the oil glands then multiply, causing inflammation. Acne is commonly triggered by changes in your hormones. These hormonal changes can cause the oil glands to get bigger and to make more sebum. Factors that can make acne worse include:   Hormone changes during adolescence.   Hormone changes during women's menstrual cycles.   Hormone changes during pregnancy.   Oil-based cosmetics and hair products.   Harshly scrubbing the skin.   Strong soaps.   Stress.   Hormone problems due to certain diseases.   Long or oily hair rubbing against the skin.   Certain medicines.   Pressure from headbands, backpacks, or shoulder pads.   Exposure to certain oils and chemicals.  SYMPTOMS   Acne often occurs on the face, neck, chest, and upper back. Symptoms include:   Small, red bumps (pimples or papules).   Whiteheads (closed comedones).   Blackheads (open comedones).   Small, pus-filled pimples (pustules).   Big, red pimples or pustules that feel tender.  More severe acne can cause:   An infected area that contains a collection of pus (abscess).   Hard, painful, fluid-filled sacs (cysts).   Scars.  DIAGNOSIS   Your caregiver can usually tell what the problem is by doing a physical exam.  TREATMENT   There are many good treatments for acne. Some are available over the counter and some  are available with a prescription. The treatment that is best for you depends on the type of acne you have and how severe it is. It may take 2 months of treatment before your acne gets better. Common treatments include:   Creams and lotions that prevent oil glands from clogging.   Creams and lotions that treat or prevent infections and inflammation.   Antibiotics applied to the skin or taken as a pill.   Pills that decrease sebum production.   Birth control pills.   Light or laser treatments.   Minor surgery.   Injections of medicine into the affected areas.   Chemicals that cause peeling of the skin.  HOME CARE INSTRUCTIONS   Good skin care is the most important part of treatment.   Wash your skin gently at least twice a day and after exercise. Always wash your skin before bed.   Use mild soap.   After each wash, apply a water-based skin moisturizer.   Keep your hair clean and off of your face. Shampoo your hair daily.   Only take medicines as directed by your caregiver.   Use a sunscreen or sunblock with SPF 30 or greater. This is especially important when you are using acne medicines.   Choose cosmetics that are noncomedogenic. This means they do not plug the oil glands.   Avoid leaning your chin or forehead on your hands.   Avoid wearing tight headbands or hats.     Avoid picking or squeezing your pimples. This can make your acne worse and cause scarring.  SEEK MEDICAL CARE IF:    Your acne is not better after 8 weeks.   Your acne gets worse.   You have a large area of skin that is red or tender.  Document Released: 12/11/2000 Document Revised: 04/30/2014 Document Reviewed: 10/02/2011  [employer] Patient Information 2015 [employer]. This information is not intended to replace advice given to you by your health care provider. Make sure you discuss any questions you have with your health care provider.

## 2015-03-20 NOTE — Progress Notes (Signed)
   Subjective:    Patient ID: Jorge Knight, male    DOB: June 07, 1976, 39 y.o.   MRN: [id number] This chart was scribed for Lesle ChrisSteven Uriah Trueba, MD by Littie Deedsichard Sun, Medical Scribe. This patient was seen in room 6 and the patient's care was started at 10:01 AM.   HPI HPI Comments: Jorge Knight is a 39 y.o. male who presents to the [facility] complaining of a cyst very close to his left eye (slightly below and to the left) that started 10 months ago, but increased in size recently. Patient wants to have it lanced because it interferes with his vision. He has had a cyst near his right eye in the past, which he had lanced. He had been on antibiotics in the past for cysts, but he has not been on antibiotics for his current cyst. Patient had seen dermatology when he was younger.    Review of Systems     Objective:   Physical Exam CONSTITUTIONAL: Well developed/well nourished HEAD: Normocephalic/atraumatic EYES: EOM/PERRL ENMT: Mucous membranes moist NECK: supple no meningeal signs SPINE: entire spine nontender CV: S1/S2 noted, no murmurs/rubs/gallops noted LUNGS: Lungs are clear to auscultation bilaterally, no apparent distress ABDOMEN: soft, nontender, no rebound or guarding GU: no cva tenderness NEURO: Pt is awake/alert, moves all extremitiesx4 EXTREMITIES: pulses normal, full ROM SKIN: warm, color normal. 1x1 cm freely movable cyst, left lateral lower lid. No redness or tenderness. PSYCH: no abnormalities of mood noted        Assessment & Plan:    I told him I did not want all (because of the location. He will be treated with warm compresses and doxycycline.I personally performed the services described in this documentation, which was scribed in my presence. The recorded information has been reviewed and is accurate.

## 2015-04-25 ENCOUNTER — Emergency Department (HOSPITAL_COMMUNITY)
Admission: EM | Admit: 2015-04-25 | Discharge: 2015-04-25 | Disposition: A | Discharge status: Home / Self Care | Payer: No Typology Code available for payment source | Attending: Emergency Medicine | Admitting: Emergency Medicine

## 2015-04-25 ENCOUNTER — Encounter (HOSPITAL_COMMUNITY): Payer: Self-pay

## 2015-04-25 DIAGNOSIS — M25511 Pain in right shoulder: Secondary | ICD-10-CM | POA: Insufficient documentation

## 2015-04-25 DIAGNOSIS — L700 Acne vulgaris: Secondary | ICD-10-CM | POA: Insufficient documentation

## 2015-04-25 DIAGNOSIS — Z720 Tobacco use: Secondary | ICD-10-CM | POA: Insufficient documentation

## 2015-04-25 MED ORDER — TRAMADOL HCL 50 MG PO TABS: 50.0000 mg | ORAL_TABLET | Freq: Four times a day (QID) | ORAL | Status: AC | PRN

## 2015-04-25 MED ORDER — NAPROXEN 500 MG PO TABS: 500.0000 mg | ORAL_TABLET | Freq: Two times a day (BID) | ORAL | Status: DC

## 2015-04-25 MED ORDER — LIDOCAINE 5 % EX PTCH: 1.0000 | MEDICATED_PATCH | CUTANEOUS | Status: DC

## 2015-04-25 NOTE — Discharge Instructions (Signed)
Acne Acne is a skin problem that causes pimples. Acne occurs when the pores Jorge Knight your skin get blocked. Your pores may become red, sore, and swollen (inflamed), or infected with a common skin bacterium (Propionibacterium acnes). Acne is a common skin problem. Up to 80% of people get acne at some time. Acne is especially common from the ages of 44 to 50. Acne usually goes away over time with proper treatment. CAUSES  Your pores each contain an oil gland. The oil glands make an oily substance called sebum. Acne happens when these glands get plugged with sebum, dead skin cells, and dirt. The P. acnes bacteria that are normally found Jorge Knight the oil glands then multiply, causing inflammation. Acne is commonly triggered by changes Jorge Knight your hormones. These hormonal changes can cause the oil glands to get bigger and to make more sebum. Factors that can make acne worse include:  Hormone changes during adolescence.  Hormone changes during women's menstrual cycles.  Hormone changes during pregnancy.  Oil-based cosmetics and hair products.  Harshly scrubbing the skin.  Strong soaps.  Stress.  Hormone problems due to certain diseases.  Long or oily hair rubbing against the skin.  Certain medicines.  Pressure from headbands, backpacks, or shoulder pads.  Exposure to certain oils and chemicals. SYMPTOMS  Acne often occurs on the face, neck, chest, and upper back. Symptoms include:  Small, red bumps (pimples or papules).  Whiteheads (closed comedones).  Blackheads (open comedones).  Small, pus-filled pimples (pustules).  Big, red pimples or pustules that feel tender. More severe acne can cause:  An infected area that contains a collection of pus (abscess).  Hard, painful, fluid-filled sacs (cysts).  Scars. DIAGNOSIS  Your caregiver can usually tell what the problem is by doing a physical exam. TREATMENT  There are many good treatments for acne. Some are available over the counter and some  are available with a prescription. The treatment that is best for you depends on the type of acne you have and how severe it is. It may take 2 months of treatment before your acne gets better. Common treatments include:  Creams and lotions that prevent oil glands from clogging.  Creams and lotions that treat or prevent infections and inflammation.  Antibiotics applied to the skin or taken as a pill.  Pills that decrease sebum production.  Birth control pills.  Light or laser treatments.  Minor surgery.  Injections of medicine into the affected areas.  Chemicals that cause peeling of the skin. HOME CARE INSTRUCTIONS  Good skin care is the most important part of treatment.  Wash your skin gently at least twice a day and after exercise. Always wash your skin before bed.  Use mild soap.  After each wash, apply a water-based skin moisturizer.  Keep your hair clean and off of your face. Shampoo your hair daily.  Only take medicines as directed by your caregiver.  Use a sunscreen or sunblock with SPF 30 or greater. This is especially important when you are using acne medicines.  Choose cosmetics that are noncomedogenic. This means they do not plug the oil glands.  Avoid leaning your chin or forehead on your hands.  Avoid wearing tight headbands or hats.  Avoid picking or squeezing your pimples. This can make your acne worse and cause scarring. SEEK MEDICAL CARE IF:   Your acne is not better after 8 weeks.  Your acne gets worse.  You have a large area of skin that is red or tender. Document Released:  12/11/2000 Document Revised: 04/30/2014 Document Reviewed: 10/02/2011 ExitCare Patient Information 2015 [place] [place]. This information is not intended to replace advice given to you by your health care provider. Make sure you discuss any questions you have with your health care provider.  Shoulder Pain The shoulder is the joint that connects your arms to your body. The  bones that form the shoulder joint include the upper arm bone (humerus), the shoulder blade (scapula), and the collarbone (clavicle). The top of the humerus is shaped like a ball and fits into a rather flat socket on the scapula (glenoid cavity). A combination of muscles and strong, fibrous tissues that connect muscles to bones (tendons) support your shoulder joint and hold the ball in the socket. Small, fluid-filled sacs (bursae) are located in different areas of the joint. They act as cushions between the bones and the overlying soft tissues and help reduce friction between the gliding tendons and the bone as you move your arm. Your shoulder joint allows a wide range of motion in your arm. This range of motion allows you to do things like scratch your back or throw a ball. However, this range of motion also makes your shoulder more prone to pain from overuse and injury. Causes of shoulder pain can originate from both injury and overuse and usually can be grouped in the following four categories:  Redness, swelling, and pain (inflammation) of the tendon (tendinitis) or the bursae (bursitis).  Instability, such as a dislocation of the joint.  Inflammation of the joint (arthritis).  Broken bone (fracture). HOME CARE INSTRUCTIONS   Apply ice to the sore area.  Put ice in a plastic bag.  Place a towel between your skin and the bag.  Leave the ice on for 15-20 minutes, 3-4 times per day for the first 2 days, or as directed by your health care provider.  Stop using cold packs if they do not help with the pain.  If you have a shoulder sling or immobilizer, wear it as long as your caregiver instructs. Only remove it to shower or bathe. Move your arm as little as possible, but keep your hand moving to prevent swelling.  Squeeze a soft ball or foam pad as much as possible to help prevent swelling.  Only take over-the-counter or prescription medicines for pain, discomfort, or fever as directed by  your caregiver. SEEK MEDICAL CARE IF:   Your shoulder pain increases, or new pain develops in your arm, hand, or fingers.  Your hand or fingers become cold and numb.  Your pain is not relieved with medicines. SEEK IMMEDIATE MEDICAL CARE IF:   Your arm, hand, or fingers are numb or tingling.  Your arm, hand, or fingers are significantly swollen or turn white or blue. MAKE SURE YOU:   Understand these instructions.  Will watch your condition.  Will get help right away if you are not doing well or get worse. Document Released: 09/23/2005 Document Revised: 04/30/2014 Document Reviewed: 11/28/2011 [facility] Patient Information 2015 [place], [place]. This information is not intended to replace advice given to you by your health care provider. Make sure you discuss any questions you have with your health care provider.

## 2015-04-25 NOTE — ED Provider Notes (Signed)
CSN: [id number]     Arrival date & time 04/25/15  1451 History  This chart was scribed for non-physician practitioner, Arthor CaptainAbigail Lavar Rosenzweig, PA-C working with Gerhard Munchobert Lockwood, MD, by Abel PrestoKara Demonbreun, ED Scribe. This patient was seen in room WTR5/WTR5 and the patient's care was started at 4:09 PM.     Chief Complaint  Patient presents with  . Shoulder Pain  . Abscess     Patient is a 39 y.o. male presenting with shoulder pain and abscess. The history is provided by the patient. No language interpreter was used.  Shoulder Pain Associated symptoms: no decreased range of motion, no muscle weakness, no numbness and no tingling   Abscess  HPI Comments: Jorge Knight is a 39 y.o. male who presents to the Emergency Department complaining of burning right shoulder pain with onset 1 week ago. Pt states certain positions alleviate the pain. Pt able to move arm well during day. Pt states pain disrupts his sleep. Pt has taken Aleve with no relief. Pt lifts heavy objects at work but denies any known injury at time of onset. Pt denies numbness, tingling, and weakness.  Pt also c/o lesion just inferior to left eyelid. Pt state when area is infected there is increased swelling and tenderness. He reports it is not currently tender, red, and has no drainage.   History reviewed. No pertinent past medical history. Past Surgical History  Procedure Laterality Date  . Abdominal surgery      bowel obstruction x 2   History reviewed. No pertinent family history. History  Substance Use Topics  . Smoking status: Current Every Day Smoker -- 1.00 packs/day    Types: Cigars  . Smokeless tobacco: Never Used  . Alcohol Use: 10.8 oz/week    7 Glasses of wine, 6 Shots of liquor, 6 Standard drinks or equivalent per week     Comment: daily beer    Review of Systems  Musculoskeletal: Positive for myalgias and arthralgias. Negative for joint swelling.  Skin:       lesion  Neurological: Negative for weakness and numbness.      Allergies  Benadryl and Promethazine  Home Medications   Prior to Admission medications   Medication Sig Start Date End Date Taking? Authorizing Provider  cyclobenzaprine (FLEXERIL) 10 MG tablet Take 1 tablet (10 mg total) by mouth 3 (three) times daily as needed for muscle spasms. Patient not taking: Reported on 03/20/2015 10/22/12   Jethro BastosAnne W Crawford, NP  doxycycline (VIBRA-TABS) 100 MG tablet Take 1 tablet (100 mg total) by mouth 2 (two) times daily. 03/20/15   Collene GobbleSteven A Daub, MD  erythromycin ophthalmic ointment Place a 1/2 inch ribbon of ointment into the lower eyelid of both eyes 4 times per day for 6 days. Patient not taking: Reported on 03/20/2015 02/10/14   Marissa Sciacca, PA-C  ibuprofen (ADVIL,MOTRIN) 600 MG tablet Take 1 tablet (600 mg total) by mouth every 6 (six) hours as needed for pain. Patient not taking: Reported on 03/20/2015 10/22/12   Jethro BastosAnne W Crawford, NP  meloxicam (MOBIC) 15 MG tablet Take 1 tablet (15 mg total) by mouth daily. Patient not taking: Reported on 03/20/2015 07/13/14   Graylon GoodZachary H Baker, PA-C  minocycline (MINOCIN) 50 MG capsule Take 2 capsules (100 mg total) by mouth 2 (two) times daily. Patient not taking: Reported on 03/20/2015 08/04/13   Elson AreasLeslie K Sofia, PA-C  ondansetron (ZOFRAN) 4 MG tablet Take 1 tablet (4 mg total) by mouth every 6 (six) hours. Patient not taking: Reported on  03/20/2015 02/10/14   Marissa Sciacca, PA-C   BP 130/82 mmHg  Pulse 82  Temp(Src) 98 F (36.7 C) (Oral)  Resp 16  SpO2 99% Physical Exam  Constitutional: He is oriented to person, place, and time. He appears well-developed and well-nourished.  HENT:  Head: Normocephalic.  Eyes: Conjunctivae are normal.  Neck: Normal range of motion. Neck supple.  Pulmonary/Chest: Effort normal.  Musculoskeletal: Normal range of motion.       Right shoulder: He exhibits tenderness. He exhibits normal range of motion, no bony tenderness and no swelling.  Pain to corticoid process with flexion  and deep extension of the shoulder No weakness noted  Neurological: He is alert and oriented to person, place, and time.  Skin: Skin is warm and dry. Lesion noted.  Small 0.5 cm cyst noted to lateral portion of left lower eyelid.   Psychiatric: He has a normal mood and affect. His behavior is normal.  Nursing note and vitals reviewed.   ED Course  Procedures (including critical care time) DIAGNOSTIC STUDIES: Oxygen Saturation is 99% on room air, normal by my interpretation.    COORDINATION OF CARE: 4:25 PM Discussed treatment plan with patient at beside compression, icing, sling, the patient agrees with the plan and has no further questions at this time.   Labs Review Labs Reviewed - No data to display  Imaging Review No results found.   EKG Interpretation None      MDM   Final diagnoses:  Right shoulder pain  Cystic acne vulgaris   Patient with cystic acne. Does not appear infected. It is just below the left lid margin and I did discussed excision with the patient. Feel that this would best be seen in outpatient services. Patient also has right shoulder pain, which appears consistent with either tendinitis or rotator cuff injury. No injury noted to the patient. I doubt fracture or dislocation. No x-ray is needed at this time. I spent 25 minutes in discussion with the patient about appropriate imaging, MRI use in the wall of the emergency department in treatment of none emergent cases. The patient became angry and stated that I didn't want to treat him because he did not have insurance. I was unable to communicate effectively with the patient. He will be discharged to follow-up with an orthopedist. Patient tells me that he can get insurance through his work, however, has opted out because he does not want to pay for it. He also states that he had insurance through the last month, but dropped it, because he only got it from [employer]. Patient given anti-inflammatory medications,  Lidoderm patch, sling. He appears safe for discharge at this time I personally performed the services described in this documentation, which was scribed in my presence. The recorded information has been reviewed and is accurate.  \    Arthor Captain, PA-C 04/28/15 1212  Gerhard Munch, MD 05/03/15 [phone]

## 2015-04-25 NOTE — ED Notes (Signed)
Patient states that he lifted a heavy object approx a week ago and now has right shoulder pain. Patient has a small abscess/cyst bottom left eye area.

## 2015-08-20 ENCOUNTER — Encounter (HOSPITAL_COMMUNITY): Payer: Self-pay | Admitting: Emergency Medicine

## 2015-08-20 ENCOUNTER — Emergency Department (INDEPENDENT_AMBULATORY_CARE_PROVIDER_SITE_OTHER)
Admission: EM | Admit: 2015-08-20 | Discharge: 2015-08-20 | Disposition: A | Discharge status: Home / Self Care | Payer: Self-pay | Source: Home / Self Care | Attending: Emergency Medicine | Admitting: Emergency Medicine

## 2015-08-20 DIAGNOSIS — M79672 Pain in left foot: Secondary | ICD-10-CM

## 2015-08-20 DIAGNOSIS — L03032 Cellulitis of left toe: Secondary | ICD-10-CM

## 2015-08-20 MED ORDER — CEPHALEXIN 500 MG PO CAPS: 500.0000 mg | ORAL_CAPSULE | Freq: Four times a day (QID) | ORAL | Status: DC

## 2015-08-20 NOTE — ED Provider Notes (Signed)
CSN: [id number]     Arrival date & time 08/20/15  1828 History   First MD Initiated Contact with Patient 08/20/15 1952     Chief Complaint  Patient presents with  . Foot Pain   (Consider location/radiation/quality/duration/timing/severity/associated sxs/prior Treatment) HPI He is a 39 year old man here for evaluation of left foot pain. He states about a week ago he noticed a small bump on the left plantar foot at the base of the little toe. Over the last week it has gotten more and more painful, to the point now where it hard to walk on it. It has not changed in size. No fevers or chills. No new shoes. He does work on his feet.  History reviewed. No pertinent past medical history. Past Surgical History  Procedure Laterality Date  . Abdominal surgery      bowel obstruction x 2   No family history on file. Social History  Substance Use Topics  . Smoking status: Current Every Day Smoker -- 1.00 packs/day    Types: Cigars  . Smokeless tobacco: Never Used  . Alcohol Use: 10.8 oz/week    7 Glasses of wine, 6 Shots of liquor, 6 Standard drinks or equivalent per week     Comment: daily beer    Review of Systems As in history of present illness Allergies  Benadryl and Promethazine  Home Medications   Prior to Admission medications   Medication Sig Start Date End Date Taking? Authorizing Provider  cephALEXin (KEFLEX) 500 MG capsule Take 1 capsule (500 mg total) by mouth 4 (four) times daily. 08/20/15   Charm Rings, MD  doxycycline (VIBRA-TABS) 100 MG tablet Take 1 tablet (100 mg total) by mouth 2 (two) times daily. 03/20/15   Collene Gobble, MD  lidocaine (LIDODERM) 5 % Place 1 patch onto the skin daily. Remove & Discard patch within 12 hours or as directed by MD 04/25/15   Arthor Captain, PA-C  naproxen (NAPROSYN) 500 MG tablet Take 1 tablet (500 mg total) by mouth 2 (two) times daily with a meal. 04/25/15   Arthor Captain, PA-C  traMADol (ULTRAM) 50 MG tablet Take 1 tablet (50 mg total)  by mouth every 6 (six) hours as needed. 04/25/15   Abigail Harris, PA-C   BP 132/86 mmHg  Pulse 78  Temp(Src) 98.4 F (36.9 C) (Oral)  Resp 16  SpO2 98% Physical Exam  Constitutional: He is oriented to person, place, and time. He appears well-developed and well-nourished. No distress.  Cardiovascular: Normal rate.   Pulmonary/Chest: Effort normal.  Neurological: He is alert and oriented to person, place, and time.  Skin:  He has a 1 cm nodule at the base of his little toe on the left plantar foot. There is a dark central spot that is approximately 2 mm. It is quite tender. There is some surrounding erythema.    ED Course  Procedures (including critical care time) Labs Review Labs Reviewed - No data to display  Imaging Review No results found.   MDM   1. Left foot pain   2. Cellulitis of toe of left foot    I suspect he either stepped on something or got an insect bite that is now infected. Treat with Keflex for 7 days. Return precautions reviewed.   Charm Rings, MD 08/20/15 2014

## 2015-08-20 NOTE — ED Notes (Signed)
Left foot pain, has noticed bump prior to the onset of pain.  Pain started one week ago.

## 2015-08-20 NOTE — Discharge Instructions (Signed)
It looks like you either stepped on something got a bug bite that is now infected. Take Keflex one pill 4 times a day for 7 days. Do Epsom salts soaks several times a day. Follow-up if worsening or no improvement in 2 days.

## 2016-03-23 ENCOUNTER — Emergency Department (HOSPITAL_COMMUNITY): Payer: PRIVATE HEALTH INSURANCE

## 2016-03-23 ENCOUNTER — Emergency Department (HOSPITAL_COMMUNITY)
Admission: EM | Admit: 2016-03-23 | Discharge: 2016-03-23 | Disposition: A | Discharge status: Home / Self Care | Payer: PRIVATE HEALTH INSURANCE | Attending: Emergency Medicine | Admitting: Emergency Medicine

## 2016-03-23 ENCOUNTER — Encounter (HOSPITAL_COMMUNITY): Payer: Self-pay | Admitting: Emergency Medicine

## 2016-03-23 DIAGNOSIS — K529 Noninfective gastroenteritis and colitis, unspecified: Secondary | ICD-10-CM | POA: Diagnosis not present

## 2016-03-23 DIAGNOSIS — F17210 Nicotine dependence, cigarettes, uncomplicated: Secondary | ICD-10-CM | POA: Diagnosis not present

## 2016-03-23 DIAGNOSIS — R112 Nausea with vomiting, unspecified: Secondary | ICD-10-CM | POA: Diagnosis present

## 2016-03-23 LAB — GASTROINTESTINAL PANEL BY PCR, STOOL (REPLACES STOOL CULTURE)

## 2016-03-23 LAB — URINALYSIS, ROUTINE W REFLEX MICROSCOPIC
GLUCOSE, UA: NEGATIVE mg/dL
HGB URINE DIPSTICK: NEGATIVE
Ketones, ur: NEGATIVE mg/dL
Leukocytes, UA: NEGATIVE
Nitrite: NEGATIVE
Protein, ur: 30 mg/dL — AB
SPECIFIC GRAVITY, URINE: 1.038 — AB (ref 1.005–1.030)
pH: 6 (ref 5.0–8.0)

## 2016-03-23 LAB — CBC
HCT: 48.2 % (ref 39.0–52.0)
HEMOGLOBIN: 17.1 g/dL — AB (ref 13.0–17.0)
MCH: 32.4 pg (ref 26.0–34.0)
MCHC: 35.5 g/dL (ref 30.0–36.0)
MCV: 91.3 fL (ref 78.0–100.0)
Platelets: 197 10*3/uL (ref 150–400)
RBC: 5.28 MIL/uL (ref 4.22–5.81)
RDW: 13.3 % (ref 11.5–15.5)
WBC: 4.3 10*3/uL (ref 4.0–10.5)

## 2016-03-23 LAB — URINE MICROSCOPIC-ADD ON

## 2016-03-23 LAB — COMPREHENSIVE METABOLIC PANEL
ALBUMIN: 4.8 g/dL (ref 3.5–5.0)
ALT: 75 U/L — AB (ref 17–63)
ANION GAP: 10 (ref 5–15)
AST: 44 U/L — AB (ref 15–41)
Alkaline Phosphatase: 75 U/L (ref 38–126)
BUN: 17 mg/dL (ref 6–20)
CHLORIDE: 102 mmol/L (ref 101–111)
CO2: 24 mmol/L (ref 22–32)
CREATININE: 0.97 mg/dL (ref 0.61–1.24)
Calcium: 9.3 mg/dL (ref 8.9–10.3)
Glucose, Bld: 105 mg/dL — ABNORMAL HIGH (ref 65–99)
POTASSIUM: 4 mmol/L (ref 3.5–5.1)
SODIUM: 136 mmol/L (ref 135–145)
Total Bilirubin: 0.9 mg/dL (ref 0.3–1.2)
Total Protein: 8.3 g/dL — ABNORMAL HIGH (ref 6.5–8.1)

## 2016-03-23 LAB — LIPASE, BLOOD: LIPASE: 26 U/L (ref 11–51)

## 2016-03-23 MED ORDER — IOPAMIDOL (ISOVUE-300) INJECTION 61%
100.0000 mL | Freq: Once | INTRAVENOUS | Status: AC | PRN
  Administered 2016-03-23: 100 mL via INTRAVENOUS

## 2016-03-23 MED ORDER — SODIUM CHLORIDE 0.9 % IV BOLUS (SEPSIS)
1000.0000 mL | Freq: Once | INTRAVENOUS | Status: AC
  Administered 2016-03-23: 1000 mL via INTRAVENOUS

## 2016-03-23 MED ORDER — METOCLOPRAMIDE HCL 5 MG/ML IJ SOLN
10.0000 mg | Freq: Once | INTRAMUSCULAR | Status: AC
  Administered 2016-03-23: 10 mg via INTRAVENOUS
  Filled 2016-03-23: qty 2

## 2016-03-23 MED ORDER — METOCLOPRAMIDE HCL 10 MG PO TABS: 10.0000 mg | ORAL_TABLET | Freq: Four times a day (QID) | ORAL | Status: AC

## 2016-03-23 MED ORDER — MORPHINE SULFATE (PF) 4 MG/ML IV SOLN
4.0000 mg | Freq: Once | INTRAVENOUS | Status: AC
  Administered 2016-03-23: 4 mg via INTRAVENOUS
  Filled 2016-03-23: qty 1

## 2016-03-23 MED ORDER — HYDROCODONE-ACETAMINOPHEN 5-325 MG PO TABS: 1.0000 | ORAL_TABLET | Freq: Four times a day (QID) | ORAL | Status: AC | PRN

## 2016-03-23 MED ORDER — LOPERAMIDE HCL 2 MG PO CAPS: 2.0000 mg | ORAL_CAPSULE | Freq: Four times a day (QID) | ORAL | Status: AC | PRN

## 2016-03-23 MED ORDER — IOHEXOL 300 MG/ML  SOLN
50.0000 mL | Freq: Once | INTRAMUSCULAR | Status: AC | PRN
  Administered 2016-03-23: 50 mL via INTRAVENOUS

## 2016-03-23 MED ORDER — ONDANSETRON HCL 4 MG/2ML IJ SOLN: 4.0000 mg | Freq: Once | INTRAMUSCULAR | Status: DC | PRN

## 2016-03-23 NOTE — Progress Notes (Signed)
CM went to provide pt with uninsured resources His mother is at the bedside and wants to share the policy information with ED registration  CM discussed and provided written information for uninsured accepting pcps, discussed the importance of pcp vs EDP services for f/u care, www.needymeds.org, www.goodrx.com, discounted pharmacies and other [place] resources such as [facility] , [facility], affordable care act, financial assistance, uninsured dental services, [place] med assist, DSS and  health department  Reviewed resources for [place] uninsured accepting pcps like [facility], [facility] [address], [facility], [facility], local urgent care centers, [facility], [facility], general medical clinics, [facility] services of the [place], [facility] urgent care plus others, medication resources, [facility] out patient pharmacies and housing Pt voiced understanding and appreciation of resources provided   Provided [facility] contact information

## 2016-03-23 NOTE — Discharge Instructions (Signed)
Return here as needed. Follow up with the GI doctor provided.  °

## 2016-03-23 NOTE — ED Provider Notes (Signed)
CSN: [id number]     Arrival date & time 03/23/16  1033 History   First MD Initiated Contact with Patient 03/23/16 1125     Chief Complaint  Patient presents with  . Emesis     (Consider location/radiation/quality/duration/timing/severity/associated sxs/prior Treatment) HPI Patient presents to the emergency department with diarrhea, vomiting and nausea over the last week.  The patient states that he has had some mild abdominal discomfort but no significant abnormality.  The patient states that he did not take any medications prior to arrival.  Patient denies chest pain, shortness of breath, headache, blurred vision, back pain, neck pain, fever, cough, dysuria, incontinence, bloody stool, hematemesis, dizziness, near syncope or syncope.  The patient states that nothing seems make his condition, better or worse History reviewed. No pertinent past medical history. Past Surgical History  Procedure Laterality Date  . Abdominal surgery      bowel obstruction x 2   History reviewed. No pertinent family history. Social History  Substance Use Topics  . Smoking status: Current Every Day Smoker -- 1.00 packs/day    Types: Cigars  . Smokeless tobacco: Never Used  . Alcohol Use: 10.8 oz/week    7 Glasses of wine, 6 Shots of liquor, 6 Standard drinks or equivalent per week     Comment: daily beer    Review of Systems All other systems negative except as documented in the HPI. All pertinent positives and negatives as reviewed in the HPI.   Allergies  Benadryl and Promethazine  Home Medications   Prior to Admission medications   Medication Sig Start Date End Date Taking? Authorizing Provider  bismuth subsalicylate (PEPTO BISMOL) 262 MG chewable tablet Chew 524 mg by mouth as needed for diarrhea or loose stools.   Yes Historical Provider, MD  traMADol (ULTRAM) 50 MG tablet Take 1 tablet (50 mg total) by mouth every 6 (six) hours as needed. Patient not taking: Reported on 03/23/2016 04/25/15    Arthor CaptainAbigail Harris, PA-C   BP 151/88 mmHg  Pulse 71  Temp(Src) 98.6 F (37 C) (Oral)  Resp 19  SpO2 100% Physical Exam  Constitutional: He is oriented to person, place, and time. He appears well-developed and well-nourished. No distress.  HENT:  Head: Normocephalic and atraumatic.  Mouth/Throat: Oropharynx is clear and moist.  Eyes: Pupils are equal, round, and reactive to light.  Neck: Normal range of motion. Neck supple.  Cardiovascular: Normal rate, regular rhythm and normal heart sounds.  Exam reveals no gallop and no friction rub.   No murmur heard. Pulmonary/Chest: Effort normal and breath sounds normal. No respiratory distress. He has no wheezes.  Abdominal: Soft. Bowel sounds are normal. He exhibits no distension. There is tenderness. There is no rebound and no guarding.  Musculoskeletal: He exhibits no edema.  Neurological: He is alert and oriented to person, place, and time. He exhibits normal muscle tone. Coordination normal.  Skin: Skin is warm and dry. No rash noted. No erythema.  Psychiatric: He has a normal mood and affect. His behavior is normal.  Nursing note and vitals reviewed.   ED Course  Procedures (including critical care time) Labs Review Labs Reviewed  COMPREHENSIVE METABOLIC PANEL - Abnormal; Notable for the following:    Glucose, Bld 105 (*)    Total Protein 8.3 (*)    AST 44 (*)    ALT 75 (*)    All other components within normal limits  CBC - Abnormal; Notable for the following:    Hemoglobin 17.1 (*)  All other components within normal limits  URINALYSIS, ROUTINE W REFLEX MICROSCOPIC (NOT AT [facility]) - Abnormal; Notable for the following:    Color, Urine AMBER (*)    Specific Gravity, Urine 1.038 (*)    Bilirubin Urine SMALL (*)    Protein, ur 30 (*)    All other components within normal limits  URINE MICROSCOPIC-ADD ON - Abnormal; Notable for the following:    Squamous Epithelial / LPF 0-5 (*)    Bacteria, UA FEW (*)    All other components  within normal limits  GASTROINTESTINAL PANEL BY PCR, STOOL (REPLACES STOOL CULTURE)  URINE CULTURE  LIPASE, BLOOD    Imaging Review Ct Abdomen Pelvis W Contrast  03/23/2016  CLINICAL DATA:  Pt c/o emesis and diarrhea x 1 week, no blood in stool or vomitus EXAM: CT ABDOMEN AND PELVIS WITH CONTRAST TECHNIQUE: Multidetector CT imaging of the abdomen and pelvis was performed using the standard protocol following bolus administration of intravenous contrast. CONTRAST:  ISOVUE-300 IOPAMIDOL (ISOVUE-300) INJECTION 61% COMPARISON:  None. FINDINGS: Lower chest: Patchy focal infiltrate versus scarring or subsegmental atelectasis in the posterior basal segment left lower lobe. Hepatobiliary: No masses or other significant abnormality. Pancreas: No mass, inflammatory changes, or other significant abnormality. Spleen: Within normal limits in size and appearance. Adrenals/Urinary Tract: No masses identified. No evidence of hydronephrosis. Stomach/Bowel: No evidence of obstruction, inflammatory process, or abnormal fluid collections. Appendix not discretely identified. Vascular/Lymphatic: No pathologically enlarged lymph nodes. No evidence of abdominal aortic aneurysm. Early patchy iliac arterial calcifications. Bilateral pelvic phleboliths. Reproductive: Mild prostatic enlargement. Other: No ascites.  No free air. Musculoskeletal:  No suspicious bone lesions identified. IMPRESSION: 1. Focal infiltrate, scarring or atelectasis in the posterior left lower lobe. 2. No acute abdominal process. Electronically Signed   By: Corlis Leak M.D.   On: 03/23/2016 14:49   I have personally reviewed and evaluated these images and lab results as part of my medical decision-making.  Patient is feeling some better following IV fluids.  He is able to tolerate oral intake and was eating food when I went back to recheck the patient.  The patient will be referred to GI.  Told to return here as needed.  Patient agrees the plan.  All  questions were answered   Charlestine Night, PA-C 03/23/16 1545  Bethann Berkshire, MD 03/24/16 [phone]

## 2016-03-23 NOTE — ED Notes (Signed)
This Clinical research associatewriter called back into room. Patient appears to be anxious, stating "I feel like I'm having to make myself breath. I almost passed out". Patient A&O, VSS with slightly elevated BP. Patient reminded of SE of morphine and informed feelings are most likely from morphine. Patient states he was getting up from bed when symptoms occurred. Patient redirected to remain sitting in bed. After calming down, BP rechecked and WNL.

## 2016-03-23 NOTE — ED Notes (Signed)
Pt c/o emesis and diarrhea x 1 week, no blood in stool or vomitus.

## 2016-03-23 NOTE — ED Notes (Signed)
Patient was informed of side effects of morphine including lightheadedness, drowsiness, warmth and heaviness. Patient also reminded not to make any sudden movements like sitting forward quickly or standing quickly. Informed patient we will wait 15 minutes to make sure patient has no reaction to medications. Patient verbalizes understanding.

## 2016-03-23 NOTE — Progress Notes (Signed)
Pt's mother called out to CM while she was near room #18 Stated something was wrong with the pt  CM noted pt to be coming off of his stretcher with linen in the floor Cm inquired about pt s/s He states "I am just not feeling well.  I feel like I am making myself breathe"  O2 sat 100% Pulse at 76-82 BP monitor placed on and pending as ED RN came to assess pt Mother stating pt flush and sweating Cool cloth and support provided

## 2016-03-25 LAB — URINE CULTURE: Culture: 4000

## 2016-03-27 ENCOUNTER — Encounter: Payer: Self-pay | Admitting: Gastroenterology

## 2016-03-27 ENCOUNTER — Ambulatory Visit (INDEPENDENT_AMBULATORY_CARE_PROVIDER_SITE_OTHER): Payer: PRIVATE HEALTH INSURANCE | Admitting: Gastroenterology

## 2016-03-27 VITALS — BP 110/52 | HR 76 | Ht 75.0 in | Wt 174.1 lb

## 2016-03-27 DIAGNOSIS — R1114 Bilious vomiting: Secondary | ICD-10-CM | POA: Diagnosis not present

## 2016-03-27 DIAGNOSIS — A09 Infectious gastroenteritis and colitis, unspecified: Secondary | ICD-10-CM

## 2016-03-27 DIAGNOSIS — R1013 Epigastric pain: Secondary | ICD-10-CM

## 2016-03-27 NOTE — Progress Notes (Signed)
[facility] Consult Note:  History: Jorge Knight 03/27/2016  Referring physician: No PCP Per Patient  Reason for consult/chief complaint: Abdominal Pain; Nausea; and Diarrhea   Subjective HPI:  This is the initial consult for a 40 year old man referred from the ED, where he visited on 03/23/2016 for vomiting and diarrhea. About a week before that he awoke one day feeling nauseated with some upper abdominal pain. Over the course of the morning it progressed to worsening pain with protracted bilious vomiting followed by passage of black watery diarrhea. He had no recent travel sick contacts or antibiotic use. He was worsen the first couple of days, but even until the ED visit, he was often waking at night with both vomiting and diarrhea, and some episodes during the day as well. ED workup was extensive and unremarkable as noted below. He was given Imodium, Reglan, Vicodin. Vomiting and the diarrhea has since stopped, he is able to eat and he has not lost weight. There's been no hematemesis or rectal bleeding. He is still having a burning upper abdominal discomfort that waxes and wanes during the day. Of note, he had no chronic digestive symptoms preceding this acute illness.  ROS:  Review of Systems  Constitutional: Negative for appetite change and unexpected weight change.  HENT: Negative for mouth sores and voice change.   Eyes: Negative for pain and redness.  Respiratory: Negative for cough and shortness of breath.   Cardiovascular: Negative for chest pain and palpitations.  Genitourinary: Negative for dysuria and hematuria.  Musculoskeletal: Negative for myalgias and arthralgias.  Skin: Negative for pallor and rash.  Neurological: Negative for weakness and headaches.  Hematological: Negative for adenopathy.     Past Medical History: Past Medical History  Diagnosis Date  . Bowel obstruction [facility])     surgery x2     Past Surgical History: Past Surgical History   Procedure Laterality Date  . Abdominal surgery      bowel obstruction x 2     Family History: No family history on file.  Social History: Social History   Social History  . Marital Status: Single    Spouse Name: N/A  . Number of Children: N/A  . Years of Education: N/A   Social History Main Topics  . Smoking status: Current Every Day Smoker -- 1.00 packs/day    Types: Cigars  . Smokeless tobacco: Never Used     Comment: pt declined info  . Alcohol Use: 11.4 oz/week    7 Glasses of wine, 6 Shots of liquor, 6 Standard drinks or equivalent per week     Comment: daily beer  . Drug Use: No  . Sexual Activity: Not Asked   Other Topics Concern  . None   Social History Narrative    Allergies: Allergies  Allergen Reactions  . Benadryl [Diphenhydramine] Other (See Comments)    Family history  . Promethazine Nausea And Vomiting    Outpatient Meds: Current Outpatient Prescriptions  Medication Sig Dispense Refill  . bismuth subsalicylate (PEPTO BISMOL) 262 MG chewable tablet Chew 524 mg by mouth as needed for diarrhea or loose stools.    Marland Kitchen HYDROcodone-acetaminophen (NORCO/VICODIN) 5-325 MG tablet Take 1 tablet by mouth every 6 (six) hours as needed for moderate pain. 15 tablet 0  . loperamide (IMODIUM) 2 MG capsule Take 1 capsule (2 mg total) by mouth 4 (four) times daily as needed for diarrhea or loose stools. 12 capsule 0  . metoCLOPramide (REGLAN) 10 MG tablet Take 1 tablet (10 mg  total) by mouth every 6 (six) hours. 30 tablet 0  . traMADol (ULTRAM) 50 MG tablet Take 1 tablet (50 mg total) by mouth every 6 (six) hours as needed. 15 tablet 0   No current facility-administered medications for this visit.      ___________________________________________________________________ Objective  Exam:  BP 110/52 mmHg  Pulse 76  Ht 6\' 3"  (1.905 m)  Wt 174 lb 2 oz (78.983 kg)  BMI 21.76 kg/m2   General: this is a(n) well-appearing an with good muscle mass  Eyes:  sclera anicteric, no redness  ENT: oral mucosa moist without lesions, no cervical or supraclavicular lymphadenopathy, good dentition  CV: RRR without murmur, S1/S2, no JVD, no peripheral edema  Resp: clear to auscultation bilaterally, normal RR and effort noted  GI: soft, mild epigastric tenderness, with active bowel sounds. No guarding or palpable organomegaly noted.midline scar without hernia  Skin; warm and dry, no rash or jaundice noted  Neuro: awake, alert and oriented x 3. Normal gross motor function and fluent speech  Labs:  3/27 nml cbc, bmp, mild elevated ast/alt nml stool stuides for bacteira n o/p, but not c diff  Radiologic Studies:  CT scan nml  Assessment: Encounter Diagnoses  Name Primary?  . Bilious vomiting with nausea Yes  . Diarrhea of infectious origin   . Abdominal pain, epigastric     It sounds like he had acute viral gastroenteritis followed by a postinfectious syndrome that is slowly resolving.  Plan:  No further testing planned. Discontinue Reglan, use when necessary Imodium if diarrhea recurs and see us as needed.  Thank you for the courtesy of this consult.  Please call me with any questions or concerns.  Charlie PitterHenry L Danis III

## 2016-03-27 NOTE — Patient Instructions (Addendum)
If you are age 38 or older, your body mass index should be between 23-30. Your Body mass index is 21.76 kg/(m^2). If this is out of the aforementioned range listed, please consider follow up with your Primary Care Provider.  If you are age 38 or younger, your body mass index should be between 19-25. Your Body mass index is 21.76 kg/(m^2). If this is out of the aformentioned range listed, please consider follow up with your Primary Care Provider.   Stop taking metoclopramide.  Use imodium as needed if there is ongoing diarrhea.  Take an over-the-counter probiotic called Align - 1 capsule at bedtime for a week.  This will help restore the normal digestive bacterial balance.  Thank you for choosing  GI  Dr Amada JupiterHenry Danis III

## 2017-03-30 IMAGING — CT CT ABD-PELV W/ CM
2 of 4 series · 16 of 46 positions shown, 18 images · IV contrast (iopamidol)
Comparison: None.

CLINICAL DATA: Pt c/o emesis and diarrhea x 1 week, no blood in
stool or vomitus

EXAM:
CT ABDOMEN AND PELVIS WITH CONTRAST
TECHNIQUE: Multidetector CT imaging of the abdomen and pelvis was performed
using the standard protocol following bolus administration of
intravenous contrast.
CONTRAST:  100mL 6ASOIX-1ZZ IOPAMIDOL (6ASOIX-1ZZ) INJECTION 61%

[Series 2: abd/pel with · axial · 0.72mm/px · z∈[+1191,+1586]mm · 13 of 87 slices shown, 15 images]
[im 4/87  soft-tissue]
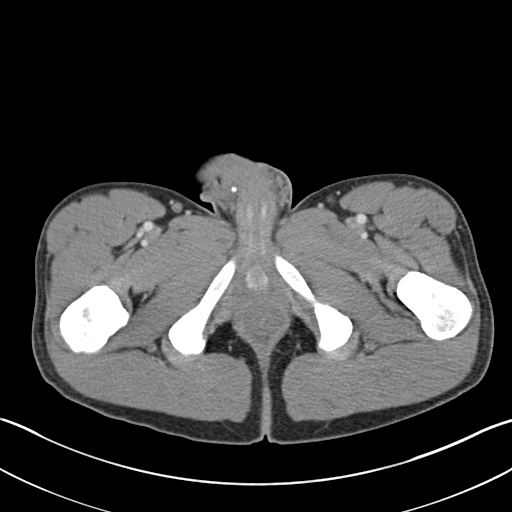
[im 4/87  bone]
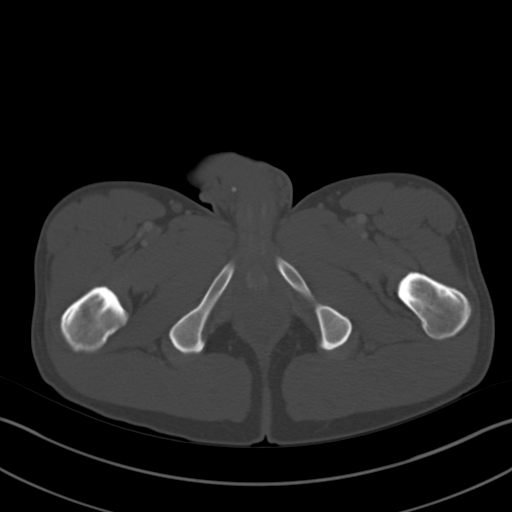
[im 11/87  soft-tissue]
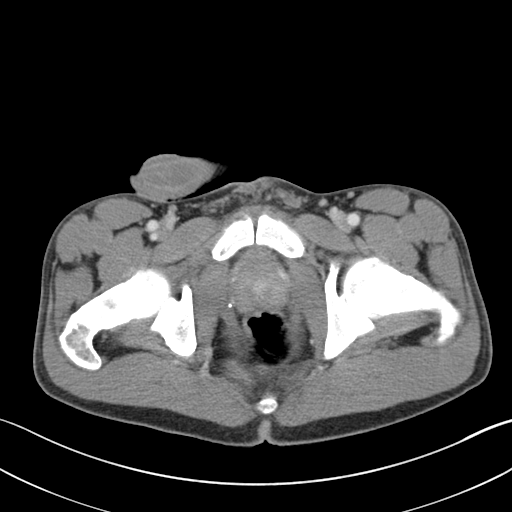
[im 18/87  soft-tissue]
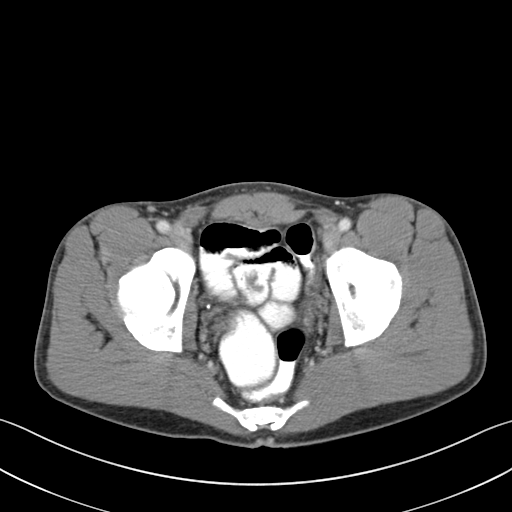
[im 25/87  soft-tissue]
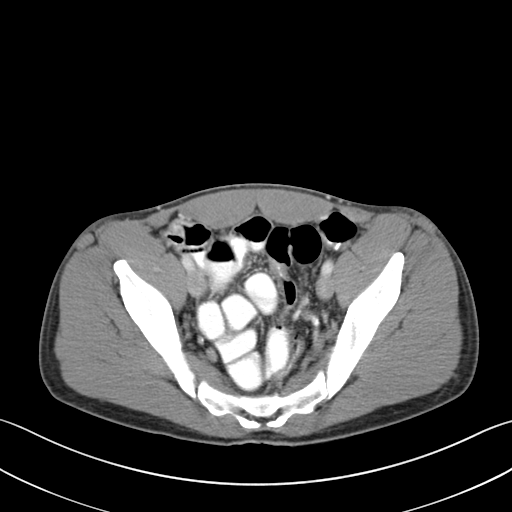
[im 31/87  soft-tissue]
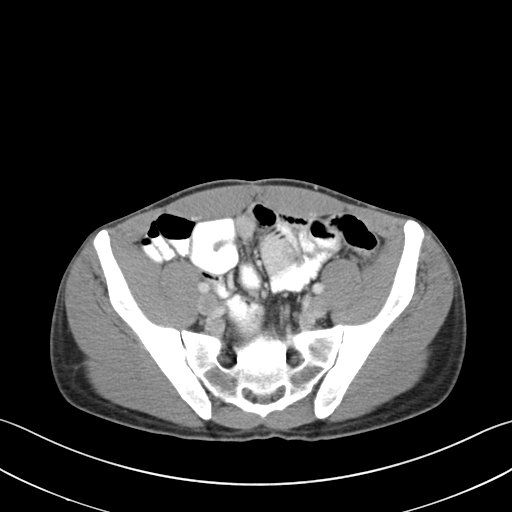
[im 38/87  soft-tissue]
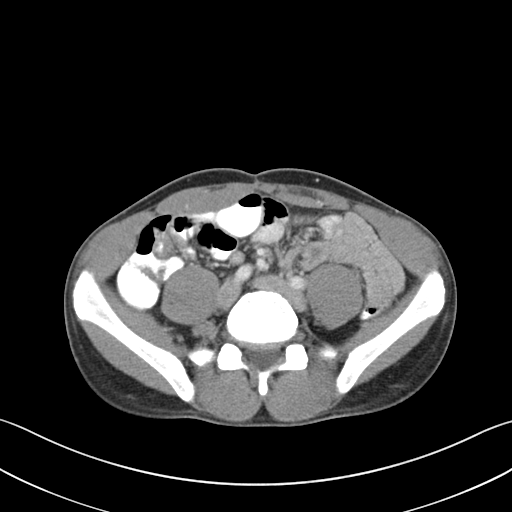
[im 45/87  soft-tissue]
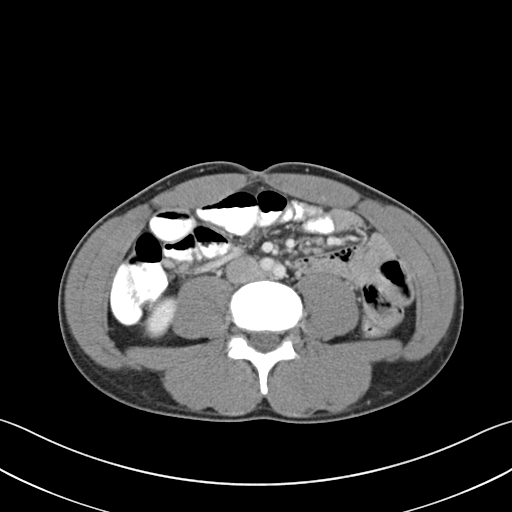
[im 49/87  soft-tissue]
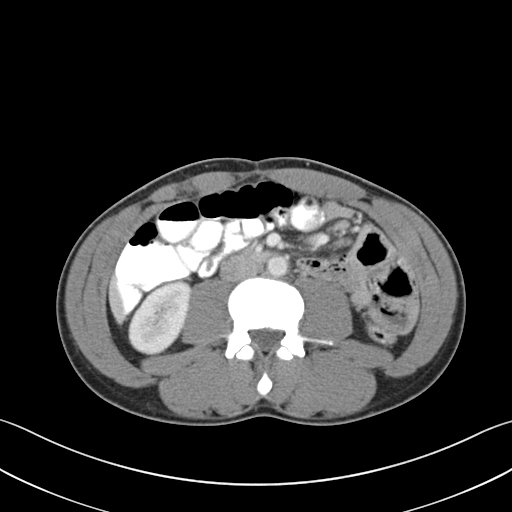
[im 56/87  soft-tissue]
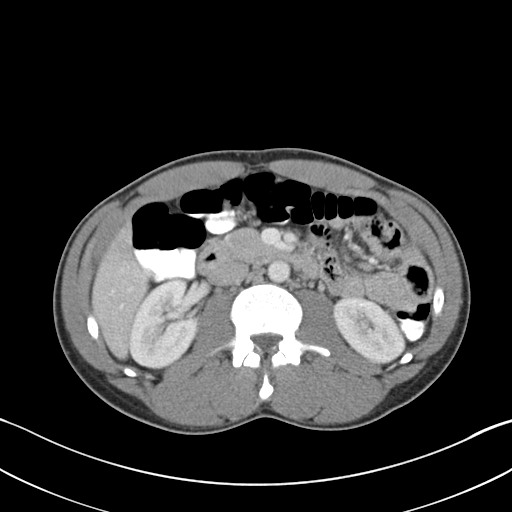
[im 56/87  bone]
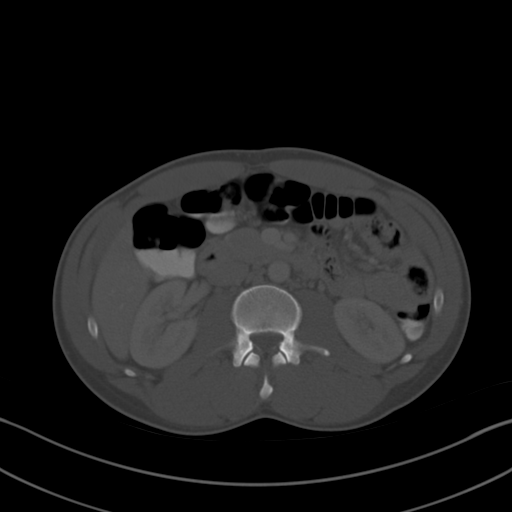
[im 62/87  soft-tissue]
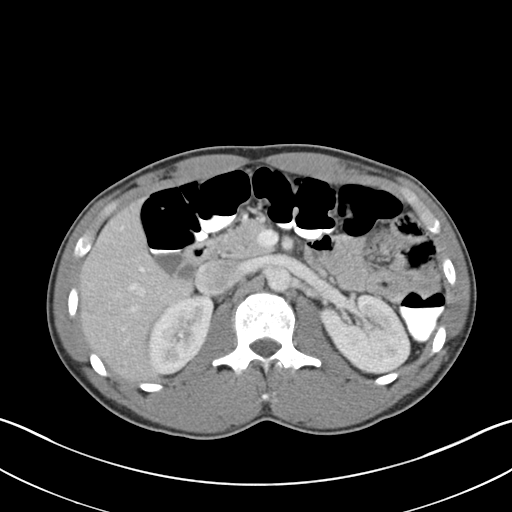
[im 69/87  soft-tissue]
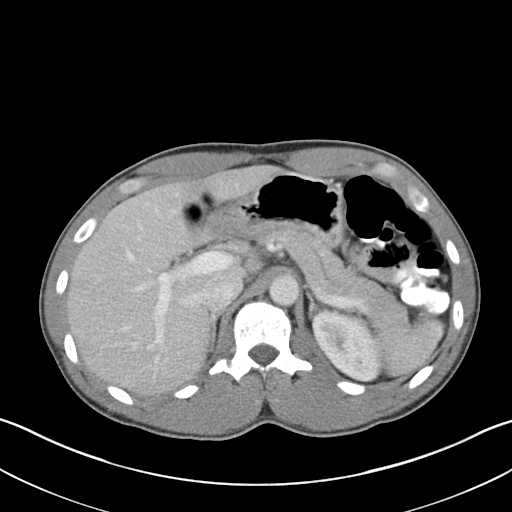
[im 76/87  soft-tissue]
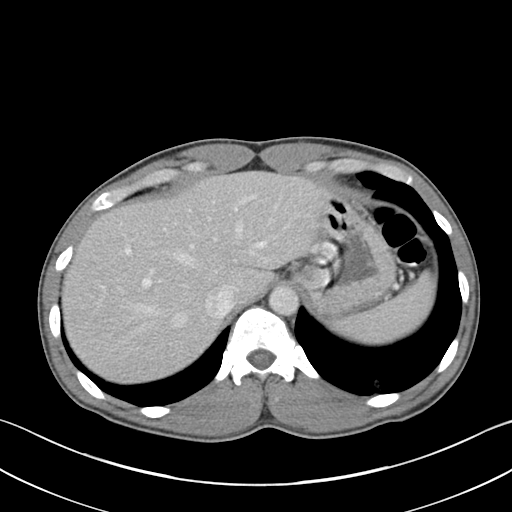
[im 83/87  soft-tissue]
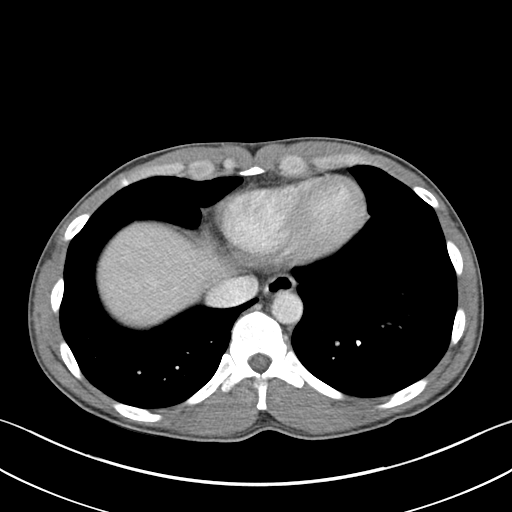

[Series 5: coronal a/|p · coronal · 0.73mm/px · 3 of 76 slices shown]
[im 26/76  soft-tissue]
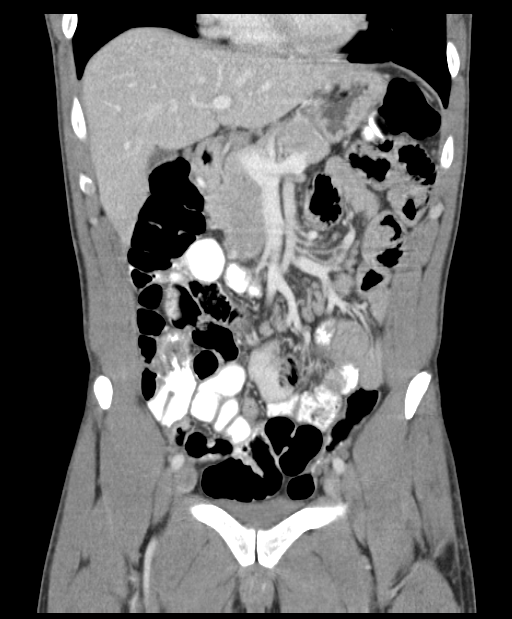
[im 34/76  soft-tissue]
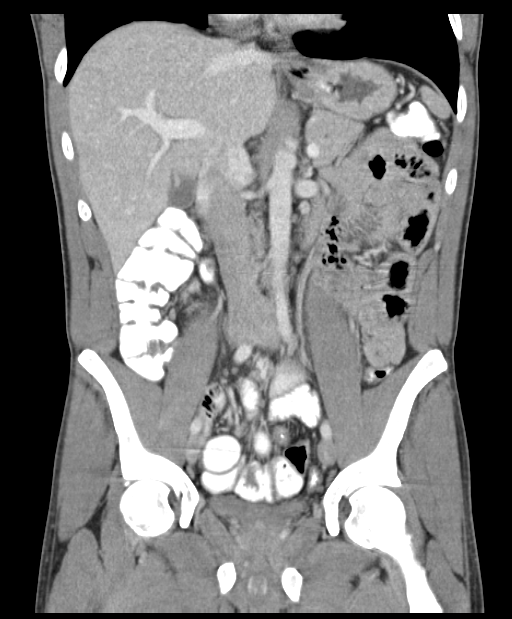
[im 42/76  soft-tissue]
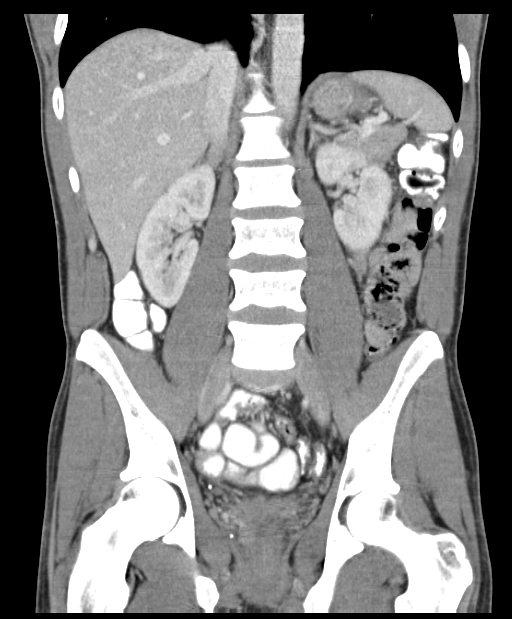

[16 of 46 positions shown; findings below may reference images not displayed]

FINDINGS: Lower chest: Patchy focal infiltrate versus scarring or subsegmental
atelectasis in the posterior basal segment left lower lobe.

Hepatobiliary: No masses or other significant abnormality.

Pancreas: No mass, inflammatory changes, or other significant
abnormality.

Spleen: Within normal limits in size and appearance.

Adrenals/Urinary Tract: No masses identified. No evidence of
hydronephrosis.

Stomach/Bowel: No evidence of obstruction, inflammatory process, or
abnormal fluid collections. Appendix not discretely identified.

Vascular/Lymphatic: No pathologically enlarged lymph nodes. No
evidence of abdominal aortic aneurysm. Early patchy iliac arterial
calcifications. Bilateral pelvic phleboliths.

Reproductive: Mild prostatic enlargement.

Other: No ascites.  No free air.

Musculoskeletal:  No suspicious bone lesions identified.
IMPRESSION: 1. Focal infiltrate, scarring or atelectasis in the posterior left
lower lobe.
2. No acute abdominal process.
